# Patient Record
Sex: Female | Born: 1965 | Hispanic: Yes | Marital: Married | Smoking: Never smoker
Health system: Southern US, Community
[De-identification: ages and names within clinical notes are randomized; demographics above are authoritative.]

## PROBLEM LIST (undated history)

## (undated) DIAGNOSIS — E119 Type 2 diabetes mellitus without complications: Secondary | ICD-10-CM

## (undated) DIAGNOSIS — I10 Essential (primary) hypertension: Secondary | ICD-10-CM

## (undated) DIAGNOSIS — T7840XA Allergy, unspecified, initial encounter: Secondary | ICD-10-CM

## (undated) HISTORY — PX: BREAST SURGERY: SHX581

## (undated) HISTORY — PX: CHOLECYSTECTOMY: SHX55

## (undated) HISTORY — PX: TUBAL LIGATION: SHX77

## (undated) HISTORY — PX: ABDOMINAL HYSTERECTOMY: SHX81

## (undated) HISTORY — DX: Allergy, unspecified, initial encounter: T78.40XA

---

## 1998-01-07 ENCOUNTER — Emergency Department (HOSPITAL_COMMUNITY): Admission: EM | Admit: 1998-01-07 | Discharge: 1998-01-07 | Payer: Self-pay | Admitting: Emergency Medicine

## 1998-02-03 ENCOUNTER — Ambulatory Visit (HOSPITAL_COMMUNITY): Admission: RE | Admit: 1998-02-03 | Discharge: 1998-02-03 | Payer: Self-pay | Admitting: Nephrology

## 1998-09-22 ENCOUNTER — Other Ambulatory Visit: Admission: RE | Admit: 1998-09-22 | Discharge: 1998-09-22 | Payer: Self-pay | Admitting: Obstetrics and Gynecology

## 1999-10-05 ENCOUNTER — Encounter: Payer: Self-pay | Admitting: Nephrology

## 1999-10-05 ENCOUNTER — Encounter: Admission: RE | Admit: 1999-10-05 | Discharge: 1999-10-05 | Payer: Self-pay | Admitting: Nephrology

## 1999-11-03 ENCOUNTER — Ambulatory Visit (HOSPITAL_COMMUNITY): Admission: RE | Admit: 1999-11-03 | Discharge: 1999-11-03 | Payer: Self-pay | Admitting: Nephrology

## 1999-11-03 ENCOUNTER — Encounter: Payer: Self-pay | Admitting: Nephrology

## 1999-11-06 ENCOUNTER — Encounter: Admission: RE | Admit: 1999-11-06 | Discharge: 1999-11-06 | Payer: Self-pay | Admitting: Nephrology

## 1999-11-06 ENCOUNTER — Encounter: Payer: Self-pay | Admitting: Nephrology

## 2000-06-18 ENCOUNTER — Encounter: Payer: Self-pay | Admitting: Nephrology

## 2000-06-18 ENCOUNTER — Encounter: Admission: RE | Admit: 2000-06-18 | Discharge: 2000-06-18 | Payer: Self-pay | Admitting: Nephrology

## 2000-07-05 ENCOUNTER — Emergency Department (HOSPITAL_COMMUNITY): Admission: EM | Admit: 2000-07-05 | Discharge: 2000-07-05 | Payer: Self-pay | Admitting: Emergency Medicine

## 2000-10-11 ENCOUNTER — Encounter: Payer: Self-pay | Admitting: Nephrology

## 2000-10-11 ENCOUNTER — Encounter: Admission: RE | Admit: 2000-10-11 | Discharge: 2000-10-11 | Payer: Self-pay | Admitting: Nephrology

## 2001-01-07 ENCOUNTER — Other Ambulatory Visit: Admission: RE | Admit: 2001-01-07 | Discharge: 2001-01-07 | Payer: Self-pay | Admitting: *Deleted

## 2001-03-07 ENCOUNTER — Encounter: Payer: Self-pay | Admitting: *Deleted

## 2001-03-07 ENCOUNTER — Ambulatory Visit (HOSPITAL_COMMUNITY): Admission: RE | Admit: 2001-03-07 | Discharge: 2001-03-07 | Payer: Self-pay | Admitting: *Deleted

## 2001-06-23 ENCOUNTER — Encounter (INDEPENDENT_AMBULATORY_CARE_PROVIDER_SITE_OTHER): Payer: Self-pay | Admitting: Specialist

## 2001-06-23 ENCOUNTER — Observation Stay (HOSPITAL_COMMUNITY): Admission: RE | Admit: 2001-06-23 | Discharge: 2001-06-24 | Payer: Self-pay | Admitting: *Deleted

## 2001-10-02 ENCOUNTER — Encounter: Payer: Self-pay | Admitting: Nephrology

## 2001-10-02 ENCOUNTER — Encounter: Admission: RE | Admit: 2001-10-02 | Discharge: 2001-10-02 | Payer: Self-pay | Admitting: Nephrology

## 2002-01-28 ENCOUNTER — Encounter: Payer: Self-pay | Admitting: Nephrology

## 2002-01-28 ENCOUNTER — Encounter: Admission: RE | Admit: 2002-01-28 | Discharge: 2002-01-28 | Payer: Self-pay | Admitting: Nephrology

## 2007-06-05 ENCOUNTER — Emergency Department (HOSPITAL_COMMUNITY): Admission: EM | Admit: 2007-06-05 | Discharge: 2007-06-05 | Payer: Self-pay | Admitting: Emergency Medicine

## 2007-06-19 ENCOUNTER — Emergency Department (HOSPITAL_COMMUNITY): Admission: EM | Admit: 2007-06-19 | Discharge: 2007-06-20 | Payer: Self-pay | Admitting: Emergency Medicine

## 2007-09-25 ENCOUNTER — Encounter: Admission: RE | Admit: 2007-09-25 | Discharge: 2007-09-25 | Payer: Self-pay | Admitting: Nephrology

## 2007-10-04 ENCOUNTER — Emergency Department (HOSPITAL_COMMUNITY): Admission: EM | Admit: 2007-10-04 | Discharge: 2007-10-04 | Payer: Self-pay | Admitting: Emergency Medicine

## 2008-01-08 ENCOUNTER — Ambulatory Visit: Payer: Self-pay | Admitting: Family Medicine

## 2008-02-18 ENCOUNTER — Ambulatory Visit (HOSPITAL_COMMUNITY): Admission: RE | Admit: 2008-02-18 | Discharge: 2008-02-18 | Payer: Self-pay | Admitting: General Surgery

## 2008-02-24 ENCOUNTER — Encounter: Admission: RE | Admit: 2008-02-24 | Discharge: 2008-02-24 | Payer: Self-pay | Admitting: General Surgery

## 2008-03-09 ENCOUNTER — Encounter (INDEPENDENT_AMBULATORY_CARE_PROVIDER_SITE_OTHER): Payer: Self-pay | Admitting: General Surgery

## 2008-03-09 ENCOUNTER — Ambulatory Visit (HOSPITAL_COMMUNITY): Admission: RE | Admit: 2008-03-09 | Discharge: 2008-03-09 | Payer: Self-pay | Admitting: General Surgery

## 2009-02-10 ENCOUNTER — Encounter: Admission: RE | Admit: 2009-02-10 | Discharge: 2009-02-10 | Payer: Self-pay | Admitting: Nephrology

## 2010-02-14 ENCOUNTER — Encounter: Admission: RE | Admit: 2010-02-14 | Discharge: 2010-02-14 | Payer: Self-pay | Admitting: Nephrology

## 2010-10-10 NOTE — Op Note (Signed)
[name], Madison Huynh              ACCOUNT NO.:  [id number]   MEDICAL RECORD NO.:  [id number]          PATIENT TYPE:  AMB   LOCATION:  DAY                          FACILITY:  [facility]   PHYSICIAN:  Almond Lint, MD       DATE OF BIRTH:  27-Oct-1965   DATE OF PROCEDURE:  03/09/2008  DATE OF DISCHARGE:                               OPERATIVE REPORT   PREOPERATIVE DIAGNOSIS:  Symptomatic cholelithiasis.   POSTOPERATIVE DIAGNOSIS:  Symptomatic cholelithiasis.   PROCEDURE:  Laparoscopic cholecystectomy with intraoperative  cholangiogram.   SURGEON:  Almond Lint, M.D.   ASSISTANT:  Anselm Pancoast. Zachery Dakins, M.D.   ANESTHESIA:  General and local.   FINDINGS:  Gallbladder with several large stones.  No filling defects on  the cholangiogram.   SPECIMENS:  Gallbladder to pathology.   ESTIMATED BLOOD LOSS:  Minimal.   COMPLICATIONS:  None known.   PROCEDURE:  [name] was identified in the holding area and taken to  the operating room, where she was placed supine on the operating room  table.  General anesthesia was induced.  Because of her prior  infraumbilical incision, we infiltrated the area above the umbilicus.  The 11 blade was used to make a vertical incision into the belly button.  The Kelly clamp was used to spread the subcutaneous tissues, and the  umbilical stalk was elevated using a Market researcher.  An additional Kocher was  placed on one side of the midline and the other Kocher placed on the  opposite side of the midline.  The 11 blade was used to incise the  fascia.  A Kelly clamp was used to confirm that we were into the  peritoneal cavity.  A 0 Vicryl on a UR6 needle was used to place a purse-  string suture around the fascial incision.  The Hasson trocar was then  advanced into the abdomen and secured with the sutures.  The  pneumoperitoneum was achieved to a pressure of 15 mmHg.  The epigastric  region was inspected, and the peritoneum and the skin both infiltrated  with local  anesthesia.  The 11 blade was used to make a transverse  incision.  The 10-mm trocar placed under direct visualization.  The  right upper quadrant was then inspected, and the patient was placed in  reverse Trendelenburg and rotated to the left.  Two 5-mm ports were  placed under direct visualization laterally.  Locking graspers were used  to retract the gallbladder cranially, and the infundibulum laterally.  The [place] dissector was placed at the epigastric port and used to  strip the peritoneum from the infundibulum of the gallbladder.  A small  artery was seen coursing over what appeared to be the duct, and this was  clipped.  The duct was then identified and dissected with the [place].  This was seen to be entering the gallbladder directly.  A  critical view was obtained.  One clip was placed on the duct on the  gallbladder side, and a ductotomy made with the Endoshears.  A Cook  catheter was advanced through the abdomen and placed  into the ductotomy  and clipped in place.  Patient was returned to the supine position, and  the gas was allowed to evacuate from the abdomen.  The cholangiogram was  shot with a cholangiocatheter and demonstrated no filling defects with  everything down to the duodenum.  The right and left ducts filled  slightly.  The common duct had no filling defects.  The  cholangiocatheter was then removed the rest of the way from the cystic  duct and the cystic duct clipped proximally three times.  Endoshears  were used to transect the duct the rest of the way.  The [place] was  then used to dissect free an additional vessel.  This additional small  vessel was clipped twice on the specimen side.  It was then cut.  Bovie  electrocautery was then used to remove the gallbladder from the  gallbladder fossa.  Once this was done, the gallbladder fossa was  inspected for bleeding and several small sites were oozing.  This was  irrigated again and reinspected for  bleeding, and there was none.  The  camera was m moved to the epigastric port, and the EndoCatch bag placed  at the umbilicus.  The gallbladder was placed in the EndoCatch bag and  then removed along with the trocar from the umbilical site.  A Kelly  clamp was used to stretch the fascia in order to pull out the  gallbladder from the incision.  The trocar was then reintroduced into  the abdomen.  The gallbladder fossa was reinspected.  There was one tiny  side oozing that was coagulated and irrigant returned clean.  The two  right upper quadrant ports and the epigastric port were removed under  direct visualization.  No bleeding was seen.  The pneumoperitoneum was  allowed to evacuate, and the Hasson trocar removed from the abdomen.  The purse-string suture was used to secure the fascia, and there was no  defect palpable at the umbilical fascial incision.  The skin over all of  the incisions was closed using a 4-0 Monocryl in a running subcuticular  fashion.  The incisions were then cleaned, dried, and dressed with  Dermabond.  The patient's was awakened from anesthesia and brought to  the PACU in stable condition.  Needle and sponge counts were correct x2.      Almond Lint, MD  Electronically Signed     FB/MEDQ  D:  03/09/2008  T:  03/09/2008  Job:  [id number]

## 2010-10-13 NOTE — Discharge Summary (Signed)
[facility]  Patient:    Madison Huynh, Madison Huynh Visit Number: [id number] MRN: [id number]          Service Type: GYN Location: 910A 9130 01 Attending Physician:  Wetzel Bjornstad Dictated by:   Antony Contras, [facility] Admit Date:  06/23/2001 Discharge Date: 06/24/2001                             Discharge Summary  DISCHARGE DIAGNOSES:          1. Menorrhagia.                               2. Endometrial polyp.  PROCEDURES:                   Total vaginal hysterectomy.  HISTORY OF PRESENT ILLNESS:   The patient is a 45 year old gravida 3, para 3 who presented to the office complaining of heavy vaginal bleeding. Saline infusion sonography showed a posterior wall defect with an endometrial polyp. 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 The patient was initially scheduled for hysteroscopic polypectomy but wished to have definitive surgery since she has does not plan to have more children.  PAST MEDICAL HISTORY:         History of previous bilateral tubal sterilization; otherwise negative.  HOSPITAL COURSE/TREATMENT:    The patient was admitted on June 23, 2001. Total vaginal hysterectomy was performed by Dr. Penni Homans assisted by Dr. Lily Peer. Findings included a normal size uterus with endometrial polyp, normal appearing ovaries and tubes.  POSTOPERATIVE COURSE:         The patient remained afebrile and was able to be discharged in satisfactory condition on her first postoperative day.  CBC:  Hematocrit 29.5, hemoglobin 9.9, WBCs 4.7, platelets 243,000.  DISPOSITION:                  The patient is to follow up in the office in three weeks. Dictated by:   Antony Contras, [facility] Attending Physician:  Wetzel Bjornstad DD:  07/04/01 TD:  07/06/01 Job: [id number]

## 2010-10-13 NOTE — Op Note (Signed)
[facility]  Patient:    Madison Huynh, Madison Huynh Visit Number: [id number] MRN: [id number]          Service Type: DSU Location: 910A 9130 01 Attending Physician:  Wetzel Bjornstad Dictated by:   Katy Fitch, M.D. Proc. Date: 06/23/01 Admit Date:  06/23/2001                             Operative Report  PREOPERATIVE DIAGNOSES:       1. Menorrhagia.                               2. Endometrial polyp.  POSTOPERATIVE DIAGNOSES:      1. Menorrhagia.                               2. Endometrial polyp.  PROCEDURE:                    Total vaginal hysterectomy.  SURGEON:                      Katy Fitch, M.D.  ASSISTANTGaetano Hawthorne. Lily Peer, M.D.  ANESTHESIA:                   General.  ESTIMATED BLOOD LOSS:         Less than 50 cc.  URINE OUTPUT:                 50 cc clear.  FLUIDS:                       1650 crystalloid.  COMPLICATIONS:                None.  SPECIMENS:                    Uterus.  FINDINGS:                     Normal-sized uterus with endometrial polyp. Normal-appearing tubes and ovaries.  DESCRIPTION OF PROCEDURE:     The patient was taken to the operating room, where general anesthesia was administered.  The patient was prepped and draped in the normal sterile fashion.  A Foley catheter was placed in the bladder and clear urine return was noted.  A weighted speculum was then placed in the vagina and the anterior lip of the cervix was grasped with a Christella Hartigan tenaculum. Pitressin solution was then injected circumferentially in the cervicovaginal mucosa.  A scalpel was then used to incise the mucosa down to the pubovesical cervical fascia.  The cervicovaginal mucosa was then dissected off sharply with Mayo scissors and bluntly using Ray-Tec on a forefinger.  The posterior peritoneum was identified, grasped with a pickup and entered sharply with Metzenbaum scissors.  A long weighted speculum was then  placed into the posterior cul-de-sac.  Entrance into the anterior cul-de-sac was then performed by grasping the peritoneum and incising sharply and placing a Deaver retractor into the anterior cul-de-sac.  A Heaney clamp was then placed on the left uterosacral and this pedicle clamped, cut and suture ligated with 0 Vicryl and held with a hemostat.  This  was then done similarly on the right. The uterine vessels were then clamped on the left and suture ligated with 0 Vicryl and cut.  This was done similarly on the right.  The broad ligament was then clamped and cut with 0 Vicryl suture for ligation.  This was done on both sides.  Then, the utero-ovarian ligaments were then cross clamped and the specimen was surgically removed.  The utero-ovarian pedicles were then doubly ligated, first with a suture and then with a free tie on both sides. Hemostasis was noted.  A ______ sponge was placed into the posterior cul-de-sac and inspection of the ovaries was noted to be normal.  There was no bleeding noted on the cuff or at each pedicle.  At this point, a running stitch starting at the left uterosacral was whip stitched to the right uterosacral to close the posterior part of the cuff.  The left angle was then sutured using a vertical mattress suture of 0 Vicryl.  This was done similarly on the right.  Three interrupted vertical mattresses of the cuff were used to close the cuff in an interrupted fashion.  The uterosacral ligaments were then injected with 0.25% Marcaine along with the vaginal cuff.  The pedicle sutures were then cut and the patient was taken to the recovery room in stable condition. Dictated by:   Katy Fitch, M.D. Attending Physician:  Wetzel Bjornstad DD:  06/23/01 TD:  06/23/01 Job: [id number]

## 2010-10-13 NOTE — H&P (Signed)
[facility]  Patient:    Madison Huynh, Madison Huynh Visit Number: [id number] MRN: [id number]          Service Type: Attending:  Katy Fitch, M.D. Dictated by:   Katy Fitch, M.D. Adm. Date:  06/20/01                           History and Physical  CHIEF COMPLAINT:              Uterine polyp with menorrhagia.  HISTORY OF PRESENT ILLNESS:   The patient is a 45 year old, G3, P3, who was seen in the office complaining of heavy menstrual bleeding.  The patient underwent saline infusion sonogram which showed a posterior wall defect consistent with an endometrial polyp.  Endometrial biopsy was performed showing secretory endometrium with no other abnormalities, hyperplasia, or malignancy.  The patient was initially scheduled for a hysteroscopic polypectomy; however, she has reconsidered and wants to have definitive surgical therapy, for she has had a previous tubal ligation and does not desire to have any more children.  The patient is scheduled for a total vaginal hysterectomy.  PAST MEDICAL HISTORY:         None.  PAST SURGICAL HISTORY:        Bilateral tubal ligation.  MEDICATIONS:                  None.  ALLERGIES:                    None.  SOCIAL HISTORY:               The patient is married, denies any tobacco, alcohol, or drugs.  FAMILY HISTORY:               Without any mental retardation or epithelial cancers.  PHYSICAL EXAMINATION:  VITAL SIGNS:                  Blood pressure 112/70.  HEENT:                        Clear.  LUNGS:                        Clear to auscultation bilaterally.  HEART:                        Regular rate and rhythm.  ABDOMEN:                      Soft, nontender, no palpable masses.  Normal abdominal bowel sounds.  EXTREMITIES:                  Without any edema, clubbing, cyanosis, or clonus.  PELVIC:                       Normal external genitalia.  CERVIX:                       Without any gross  lesions.  Uterus firm, mobile and intact.  Adnexa without any masses, nontender.  ASSESSMENT AND PLAN:          A 45 year old G3, P3, with uterine polyp and menorrhagia.  Will perform total vaginal hysterectomy.  The patient was discussed due to her age, we will leave her ovaries in unless there  are abnormalities.  The patient was explained the risks of the procedure including bleeding; transfusion; hepatitis; or human immunodeficiency virus with transfusion; infection; scar tissue; wound breakdown; damage to internal organs including the bowel, bladder, or ureters; fistula formation; need for laparotomy; anesthetic complications; blood clots; stroke; pulmonary embolism; death; and also inability to conceive in the future due to removal of the uterus.  The patient states she understands these risks and desires to proceed.  She will have her surgery on the morning of the 27th. Dictated by:   Katy Fitch, M.D. Attending:  Katy Fitch, M.D. DD:  06/20/01 TD:  06/20/01 Job: [id number]

## 2011-02-27 LAB — HEMOGLOBIN AND HEMATOCRIT, BLOOD: HCT: 38.6

## 2011-03-02 ENCOUNTER — Other Ambulatory Visit: Payer: Self-pay | Admitting: Obstetrics and Gynecology

## 2011-03-06 ENCOUNTER — Other Ambulatory Visit: Payer: Self-pay

## 2011-04-09 ENCOUNTER — Ambulatory Visit
Admission: RE | Admit: 2011-04-09 | Discharge: 2011-04-09 | Disposition: A | Discharge status: Home / Self Care | Payer: No Typology Code available for payment source | Source: Ambulatory Visit | Attending: Obstetrics and Gynecology | Admitting: Obstetrics and Gynecology

## 2011-04-09 MED ORDER — IOHEXOL 300 MG/ML  SOLN
100.0000 mL | Freq: Once | INTRAMUSCULAR | Status: AC | PRN
  Administered 2011-04-09: 100 mL via INTRAVENOUS

## 2013-04-07 ENCOUNTER — Ambulatory Visit: Payer: BC Managed Care – PPO | Admitting: Family Medicine

## 2013-04-07 VITALS — BP 128/76 | HR 106 | Temp 99.8°F | Resp 16 | Ht 62.0 in | Wt 199.4 lb

## 2013-04-07 DIAGNOSIS — R319 Hematuria, unspecified: Secondary | ICD-10-CM

## 2013-04-07 DIAGNOSIS — R42 Dizziness and giddiness: Secondary | ICD-10-CM

## 2013-04-07 DIAGNOSIS — R509 Fever, unspecified: Secondary | ICD-10-CM

## 2013-04-07 DIAGNOSIS — N390 Urinary tract infection, site not specified: Secondary | ICD-10-CM

## 2013-04-07 LAB — POCT CBC
Granulocyte percent: 82.6 %G — AB (ref 37–80)
HCT, POC: 42.3 % (ref 37.7–47.9)
Hemoglobin: 13 g/dL (ref 12.2–16.2)
Lymph, poc: 1.3 (ref 0.6–3.4)
MCH, POC: 27.7 pg (ref 27–31.2)
MCHC: 30.7 g/dL — AB (ref 31.8–35.4)
MCV: 90.2 fL (ref 80–97)
MID (cbc): 0.6 (ref 0–0.9)
MPV: 7.8 fL (ref 0–99.8)
POC Granulocyte: 8.8 — AB (ref 2–6.9)
POC LYMPH PERCENT: 11.9 %L (ref 10–50)
POC MID %: 5.5 %M (ref 0–12)
Platelet Count, POC: 293 10*3/uL (ref 142–424)
RBC: 4.69 M/uL (ref 4.04–5.48)
RDW, POC: 13.9 %
WBC: 10.6 10*3/uL — AB (ref 4.6–10.2)

## 2013-04-07 LAB — POCT UA - MICROSCOPIC ONLY
Casts, Ur, LPF, POC: NEGATIVE
Crystals, Ur, HPF, POC: NEGATIVE
Mucus, UA: POSITIVE
Yeast, UA: NEGATIVE

## 2013-04-07 LAB — POCT URINALYSIS DIPSTICK
Glucose, UA: NEGATIVE
Ketones, UA: 40
Leukocytes, UA: NEGATIVE
Nitrite, UA: NEGATIVE
Protein, UA: 30
Spec Grav, UA: 1.025
Urobilinogen, UA: 1
pH, UA: 6.5

## 2013-04-07 LAB — GLUCOSE, POCT (MANUAL RESULT ENTRY): POC Glucose: 129 mg/dl — AB (ref 70–99)

## 2013-04-07 MED ORDER — CIPROFLOXACIN HCL 500 MG PO TABS: 500.0000 mg | ORAL_TABLET | Freq: Two times a day (BID) | ORAL | Status: DC

## 2013-04-07 NOTE — Patient Instructions (Signed)
I am concerned with the blood in the urine.  You appear to have a urinary tract infection, but the blood is atypical for a simple urinary infection   Hematuria, Adult Hematuria (blood in your urine) can be caused by a bladder infection (cystitis), kidney infection (pyelonephritis), prostate infection (prostatitis), or kidney stone. Infections will usually respond to antibiotics (medications which kill germs), and a kidney stone will usually pass through your urine without further treatment. If you were put on antibiotics, take all the medicine until gone. You may feel better in a few days, but take all of your medicine or the infection may not respond and become more difficult to treat. If antibiotics were not given, an infection did not cause the blood in the urine. A further work up to find out the reason may be needed. HOME CARE INSTRUCTIONS   Drink lots of fluid, 3 to 4 quarts a day. If you have been diagnosed with an infection, cranberry juice is especially recommended, in addition to large amounts of water.  Avoid caffeine, tea, and carbonated beverages, because they tend to irritate the bladder.  Avoid alcohol as it may irritate the prostate.  Only take over-the-counter or prescription medicines for pain, discomfort, or fever as directed by your caregiver.  If you have been diagnosed with a kidney stone follow your caregivers instructions regarding straining your urine to catch the stone. TO PREVENT FURTHER INFECTIONS:  Empty the bladder often. Avoid holding urine for long periods of time.  After a bowel movement, women should cleanse front to back. Use each tissue only once.  Empty the bladder before and after sexual intercourse if you are a female.  Return to your caregiver if you develop back pain, fever, nausea (feeling sick to your stomach), vomiting, or your symptoms (problems) are not better in 3 days. Return sooner if you are getting worse. If you have been requested to return  for further testing make sure to keep your appointments. If an infection is not the cause of blood in your urine, X-rays may be required. Your caregiver will discuss this with you. SEEK IMMEDIATE MEDICAL CARE IF:   You have a persistent fever over 102 F (38.9 C).  You develop severe vomiting and are unable to keep the medication down.  You develop severe back or abdominal pain despite taking your medications.  You begin passing a large amount of blood or clots in your urine.  You feel extremely weak or faint, or pass out. MAKE SURE YOU:   Understand these instructions.  Will watch your condition.  Will get help right away if you are not doing well or get worse. Document Released: 05/14/2005 Document Revised: 08/06/2011 Document Reviewed: 01/01/2008 [facility] Patient Information 2014 [place], [place].

## 2013-04-07 NOTE — Progress Notes (Signed)
Subjective:    Patient ID: Madison Huynh, female    DOB: Aug 21, 1965, 47 y.o.   MRN: [id number]  This chart was scribed for Elvina Sidle, MD by Blanchard Kelch, ED Scribe. The patient was seen in room 11. Patient's care was started at 9:07 PM.   HPI  Madison Huynh is a 47 y.o. female who presents to office complaining of central and lower abdominal pain that began yesterday after getting back from [place]. The pain radiates to her back. She has associated fever, chills, headache and diarrhea that has been worsening since being picked up from the airport. She ate something at the airport in [place] that may have been suspicious. Some family members who ate the same thing were complaining of similar symptoms. Her family states that every time she takes a trip to [place] her feet and legs swell. She denies a medical history of diabetes.  She and her family are from [place].   Review of Systems  Constitutional: Positive for fever and chills.  HENT: Negative for drooling.   Eyes: Negative for discharge.  Respiratory: Negative for cough.   Cardiovascular: Positive for leg swelling.  Gastrointestinal: Positive for abdominal pain and diarrhea. Negative for vomiting.  Endocrine: Negative for polyuria.  Musculoskeletal: Negative for gait problem.  Skin: Negative for rash.  Allergic/Immunologic: Negative for immunocompromised state.  Neurological: Positive for headaches. Negative for speech difficulty.  Hematological: Negative for adenopathy.  Psychiatric/Behavioral: Negative for confusion.       Objective:   Physical Exam  Nursing note and vitals reviewed. Constitutional: She is oriented to person, place, and time. She appears well-developed and well-nourished. No distress.  HENT:  Head: Normocephalic and atraumatic.  Eyes: EOM are normal.  Neck: Neck supple. No tracheal deviation present.  Cardiovascular: Normal rate.   Pulmonary/Chest: Effort normal. No respiratory  distress.  Abdominal: There is tenderness.  Musculoskeletal: Normal range of motion.  Neurological: She is alert and oriented to person, place, and time.  Skin: Skin is warm and dry.  Psychiatric: She has a normal mood and affect. Her behavior is normal.   Patient is tender in epigastrium  Results for orders placed in visit on 04/07/13  POCT URINALYSIS DIPSTICK      Result Value Range   Color, UA yellow     Clarity, UA clear     Glucose, UA neg     Bilirubin, UA small     Ketones, UA 40     Spec Grav, UA 1.025     Blood, UA tr-lysed     pH, UA 6.5     Protein, UA 30     Urobilinogen, UA 1.0     Nitrite, UA neg     Leukocytes, UA Negative    POCT UA - MICROSCOPIC ONLY      Result Value Range   WBC, Ur, HPF, POC 1-3     RBC, urine, microscopic 14-17     Bacteria, U Microscopic 2+     Mucus, UA pos     Epithelial cells, urine per micros 2-3     Crystals, Ur, HPF, POC neg     Casts, Ur, LPF, POC neg     Yeast, UA neg    POCT CBC      Result Value Range   WBC 10.6 (*) 4.6 - 10.2 K/uL   Lymph, poc 1.3  0.6 - 3.4   POC LYMPH PERCENT 11.9  10 - 50 %L   MID (cbc) 0.6  0 -  0.9   POC MID % 5.5  0 - 12 %M   POC Granulocyte 8.8 (*) 2 - 6.9   Granulocyte percent 82.6 (*) 37 - 80 %G   RBC 4.69  4.04 - 5.48 M/uL   Hemoglobin 13.0  12.2 - 16.2 g/dL   HCT, POC 16.1  09.6 - 47.9 %   MCV 90.2  80 - 97 fL   MCH, POC 27.7  27 - 31.2 pg   MCHC 30.7 (*) 31.8 - 35.4 g/dL   RDW, POC 04.5     Platelet Count, POC 293  142 - 424 K/uL   MPV 7.8  0 - 99.8 fL  GLUCOSE, POCT (MANUAL RESULT ENTRY)      Result Value Range   POC Glucose 129 (*) 70 - 99 mg/dl       Assessment & Plan:  Fever, unspecified - Plan: POCT urinalysis dipstick, POCT UA - Microscopic Only, POCT CBC, POCT glucose (manual entry), Urine culture, ciprofloxacin (CIPRO) 500 MG tablet  Dizziness and giddiness - Plan: POCT urinalysis dipstick, POCT UA - Microscopic Only, POCT CBC, POCT glucose (manual entry), Urine  culture  Hematuria - Plan: ciprofloxacin (CIPRO) 500 MG tablet, Ambulatory referral to Urology  Signed, Elvina Sidle, MD    I personally performed the services described in this documentation, which was scribed in my presence. The recorded information has been reviewed and is accurate.

## 2013-04-09 LAB — URINE CULTURE: Organism ID, Bacteria: 50000

## 2013-10-14 ENCOUNTER — Ambulatory Visit: Payer: BC Managed Care – PPO | Admitting: Family Medicine

## 2013-10-14 VITALS — BP 122/74 | HR 76 | Temp 98.0°F | Resp 17 | Ht 61.0 in | Wt 193.0 lb

## 2013-10-14 DIAGNOSIS — R059 Cough, unspecified: Secondary | ICD-10-CM

## 2013-10-14 DIAGNOSIS — J329 Chronic sinusitis, unspecified: Secondary | ICD-10-CM

## 2013-10-14 DIAGNOSIS — H109 Unspecified conjunctivitis: Secondary | ICD-10-CM

## 2013-10-14 DIAGNOSIS — R05 Cough: Secondary | ICD-10-CM

## 2013-10-14 MED ORDER — HYDROCODONE-HOMATROPINE 5-1.5 MG/5ML PO SYRP: 5.0000 mL | ORAL_SOLUTION | Freq: Three times a day (TID) | ORAL | Status: AC | PRN

## 2013-10-14 MED ORDER — TOBRAMYCIN 0.3 % OP SOLN: 1.0000 [drp] | Freq: Four times a day (QID) | OPHTHALMIC | Status: AC

## 2013-10-14 MED ORDER — PREDNISONE 20 MG PO TABS: ORAL_TABLET | ORAL | Status: AC

## 2013-10-14 MED ORDER — AMOXICILLIN 875 MG PO TABS: 875.0000 mg | ORAL_TABLET | Freq: Two times a day (BID) | ORAL | Status: DC

## 2013-10-14 NOTE — Patient Instructions (Signed)

## 2013-10-14 NOTE — Progress Notes (Addendum)
° °  Subjective:    Patient ID: Madison Huynh, female    DOB: August 01, 1965, 48 y.o.   MRN: [id number]  Sinusitis Associated symptoms include congestion, sinus pressure and a sore throat.   Chief Complaint  Patient presents with   Sinusitis   Allergies   Nasal Congestion   Insomnia   Fatigue   This chart was scribed for Elvina SidleKurt Lauenstein, MD by Andrew Auaven Small, ED Scribe. This patient was seen in room 9 and the patient's care was started at 10:36 AM.  HPI Comments: Madison Huynh is a 48 y.o. female who presents to the [facility] complaining of worsening sinusitis onset month. Pt reports chest congestion, nasal congestion, sore irritated throat, eye redness, body aches that worsened last night. Pt has been unable to sleep due to symptoms. She reports symptoms worsen at night. Pt has tried OTC allergy medication without relief to symptoms.Pt denies using nasal spray.   Past Medical History  Diagnosis Date   Allergy    No Known Allergies Prior to Admission medications   Medication Sig Start Date End Date Taking? Authorizing Provider  cetirizine (ZYRTEC) 10 MG tablet Take 10 mg by mouth daily.   Yes Historical Provider, MD   Review of Systems  Constitutional: Positive for fatigue.  HENT: Positive for congestion, sinus pressure and sore throat.   Eyes: Positive for redness.      Objective:   Physical Exam  Nursing note and vitals reviewed. Constitutional: She is oriented to person, place, and time. She appears well-developed and well-nourished. No distress.  HENT:  Head: Normocephalic and atraumatic.  Nasal passage is swollen Right shows retraction with amber bubble (glue ear)  Eyes: EOM are normal. Left conjunctiva is injected.  Neck: Normal range of motion. Neck supple.  Cardiovascular: Normal rate.   Pulmonary/Chest: Effort normal. She has wheezes (faint  bilaterally).  Musculoskeletal: Normal range of motion.  Lymphadenopathy:    She has no cervical  adenopathy.  Neurological: She is alert and oriented to person, place, and time.  Skin: Skin is warm and dry.  Psychiatric: She has a normal mood and affect. Her behavior is normal.      Assessment & Plan:   1. Sinusitis   2. Conjunctivitis   3. Cough     Meds ordered this encounter  Medications   cetirizine (ZYRTEC) 10 MG tablet    Sig: Take 10 mg by mouth daily.   Sinusitis - Plan: predniSONE (DELTASONE) 20 MG tablet, amoxicillin (AMOXIL) 875 MG tablet  Conjunctivitis - Plan: tobramycin (TOBREX) 0.3 % ophthalmic solution  Cough - Plan: predniSONE (DELTASONE) 20 MG tablet, HYDROcodone-homatropine (HYCODAN) 5-1.5 MG/5ML syrup  Signed, Elvina SidleKurt Lauenstein, MD

## 2013-11-04 ENCOUNTER — Ambulatory Visit (INDEPENDENT_AMBULATORY_CARE_PROVIDER_SITE_OTHER): Payer: BC Managed Care – PPO | Admitting: Family Medicine

## 2013-11-04 VITALS — BP 101/69 | HR 69 | Temp 98.7°F | Resp 18 | Ht 61.0 in | Wt 191.0 lb

## 2013-11-04 DIAGNOSIS — R197 Diarrhea, unspecified: Secondary | ICD-10-CM

## 2013-11-04 DIAGNOSIS — R110 Nausea: Secondary | ICD-10-CM

## 2013-11-04 DIAGNOSIS — K529 Noninfective gastroenteritis and colitis, unspecified: Secondary | ICD-10-CM

## 2013-11-04 DIAGNOSIS — H6980 Other specified disorders of Eustachian tube, unspecified ear: Secondary | ICD-10-CM

## 2013-11-04 DIAGNOSIS — K5289 Other specified noninfective gastroenteritis and colitis: Secondary | ICD-10-CM

## 2013-11-04 DIAGNOSIS — R1084 Generalized abdominal pain: Secondary | ICD-10-CM

## 2013-11-04 LAB — POCT CBC
GRANULOCYTE PERCENT: 81.1 % — AB (ref 37–80)
HCT, POC: 41.2 % (ref 37.7–47.9)
HEMOGLOBIN: 13 g/dL (ref 12.2–16.2)
Lymph, poc: 0.7 (ref 0.6–3.4)
MCH: 27.8 pg (ref 27–31.2)
MCHC: 31.6 g/dL — AB (ref 31.8–35.4)
MCV: 88 fL (ref 80–97)
MID (CBC): 0.3 (ref 0–0.9)
MPV: 8.8 fL (ref 0–99.8)
POC GRANULOCYTE: 4.6 (ref 2–6.9)
POC LYMPH %: 12.9 % (ref 10–50)
POC MID %: 6 % (ref 0–12)
Platelet Count, POC: 284 10*3/uL (ref 142–424)
RBC: 4.68 M/uL (ref 4.04–5.48)
RDW, POC: 14.3 %
WBC: 5.7 10*3/uL (ref 4.6–10.2)

## 2013-11-04 LAB — GLUCOSE, POCT (MANUAL RESULT ENTRY): POC Glucose: 86 mg/dl (ref 70–99)

## 2013-11-04 MED ORDER — ONDANSETRON 4 MG PO TBDP: 4.0000 mg | ORAL_TABLET | Freq: Three times a day (TID) | ORAL | Status: DC | PRN

## 2013-11-04 NOTE — Progress Notes (Signed)
Subjective:    Patient ID: Madison Huynh, female    DOB: 11-22-1965, 48 y.o.   MRN: [id number]  This chart was scribed for Shade Flood, MD by Blanchard Kelch, ED Scribe.  Chief Complaint  Patient presents with  . Abdominal Pain    X 1DAY  . Diarrhea    X 1 DAY  . Nausea    X 1 DAY  . Headache    X1 DAY    PCP: No PCP Per Patient   HPI  Madison Huynh is a 48 y.o. female who presents to office complaining of abdominal pain, nausea, headache and diarrhea. Last seen 05/20 for sinusitis and cough. Prescribed Prednisone and Amoxicillin as well as eye drops and cough syrup.  Abdominal pain/Diarrhea: She started feeling abdominal "gurgling" around 5 PM yesterday. She also reports chills and subjective fever at that time. She states that watery, non-bloody diarrhea began around 7PM with about four or five episodes last night and three episodes this morning. She states that she felt heartburn last night but denies any episodes of vomiting. She has had a decreased appetite since the pain began and has not been drinking fluids either. 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 She denies recent foreign travel. She states that yesterday she ate at [facility] and ate meat there but did not eat any undercooked foods that she noticed. However, after eating there she felt like a "bowl in her stomach" in the epigastric region and has been having abdominal pain since then. She reports a past surgical history of cholecystectomy and hysterectomy. She denies recent antibiotic use other than the amoxicillin prescribed 5/20. She reports seasonal allergies that she is taking OTC Zyrtec for.   There are no active problems to display for this patient.  Past Medical History  Diagnosis Date  . Allergy    Past Surgical History  Procedure Laterality Date  . Breast surgery    . Cholecystectomy    . Abdominal hysterectomy    . Tubal ligation     No Known Allergies Prior to Admission  medications   Medication Sig Start Date End Date Taking? Authorizing Provider  ibuprofen (ADVIL,MOTRIN) 200 MG tablet Take 200 mg by mouth every 6 (six) hours as needed.   Yes Historical Provider, MD  ibuprofen (ADVIL,MOTRIN) 800 MG tablet Take 800 mg by mouth every 8 (eight) hours as needed.   Yes Historical Provider, MD  orlistat (ALLI) 60 MG capsule Take 60 mg by mouth 3 (three) times daily with meals.   Yes Historical Provider, MD  cetirizine (ZYRTEC) 10 MG tablet Take 10 mg by mouth daily.    Historical Provider, MD  HYDROcodone-homatropine (HYCODAN) 5-1.5 MG/5ML syrup Take 5 mLs by mouth every 8 (eight) hours as needed for cough. 10/14/13   Elvina Sidle, MD  predniSONE (DELTASONE) 20 MG tablet 2 daily with food 10/14/13   Elvina Sidle, MD  tobramycin (TOBREX) 0.3 % ophthalmic solution Place 1 drop into the left eye every 6 (six) hours. 10/14/13   Elvina Sidle, MD   History   Social History  . Marital Status: Married    Spouse Name: N/A    Number of Children: N/A  . Years of Education: N/A   Occupational History  . Not on file.   Social History Main Topics  . Smoking status: Never Smoker   . Smokeless tobacco: Not on file  . Alcohol Use: No  . Drug Use: No  . Sexual Activity: Not  on file   Other Topics Concern  . Not on file   Social History Narrative  . No narrative on file     Review of Systems  Constitutional: Positive for fever (subjective), chills and appetite change.  HENT: Negative for rhinorrhea.   Eyes: Negative for visual disturbance.  Respiratory: Negative for cough and shortness of breath.   Cardiovascular: Negative for chest pain and leg swelling.  Gastrointestinal: Positive for nausea, abdominal pain and diarrhea. Negative for vomiting.  Genitourinary: Negative for dysuria, frequency, hematuria and decreased urine volume.  Musculoskeletal: Negative for back pain.  Skin: Negative for rash.  Neurological: Positive for headaches.  Hematological:  Does not bruise/bleed easily.  Psychiatric/Behavioral: Negative for confusion.       Objective:   Physical Exam  Nursing note and vitals reviewed. Constitutional: She is oriented to person, place, and time. She appears well-developed and well-nourished. No distress.  HENT:  Head: Normocephalic and atraumatic.  Right Ear: Ear canal normal.  Left Ear: Ear canal normal.  Nose: Mucosal edema present.  Mouth/Throat: Oropharynx is clear and moist.  Moist oral mucosa in the mouth. Clear fluid behind right and left TM without erythema. Canals are clear bilaterally.  Edema in nasal turbinates bilaterally.   Eyes: Conjunctivae and EOM are normal.  Neck: Normal range of motion. No tracheal deviation present.  Cardiovascular: Normal rate and regular rhythm.  Exam reveals no gallop and no friction rub.   No murmur heard. Pulmonary/Chest: Effort normal. No respiratory distress.  Abdominal: Soft. She exhibits no distension. Bowel sounds are increased. There is tenderness. There is no rebound and no guarding.  Minimal RLQ tenderness. No CVA tenderness. Negative Murphy's sign. Negative heel jar.  Musculoskeletal: Normal range of motion.  Neurological: She is alert and oriented to person, place, and time.  Skin: Skin is warm and dry.  Psychiatric: She has a normal mood and affect. Her behavior is normal.    Filed Vitals:   11/04/13 0942  BP: 101/69  Pulse: 69  Temp: 98.7 F (37.1 C)  TempSrc: Oral  Resp: 18  Height: 5\' 1"  (1.549 m)  Weight: 191 lb (86.637 kg)  SpO2: 97%   Results for orders placed in visit on 11/04/13  POCT CBC      Result Value Ref Range   WBC 5.7  4.6 - 10.2 K/uL   Lymph, poc 0.7  0.6 - 3.4   POC LYMPH PERCENT 12.9  10 - 50 %L   MID (cbc) 0.3  0 - 0.9   POC MID % 6.0  0 - 12 %M   POC Granulocyte 4.6  2 - 6.9   Granulocyte percent 81.1 (*) 37 - 80 %G   RBC 4.68  4.04 - 5.48 M/uL   Hemoglobin 13.0  12.2 - 16.2 g/dL   HCT, POC 16.1  09.6 - 47.9 %   MCV 88.0  80 -  97 fL   MCH, POC 27.8  27 - 31.2 pg   MCHC 31.6 (*) 31.8 - 35.4 g/dL   RDW, POC 04.5     Platelet Count, POC 284  142 - 424 K/uL   MPV 8.8  0 - 99.8 fL  GLUCOSE, POCT (MANUAL RESULT ENTRY)      Result Value Ref Range   POC Glucose 86  70 - 99 mg/dl       Assessment & Plan:   Madison Huynh is a 48 y.o. female Nausea, Abdominal pain, generalized - Plan: POCT CBC, POCT glucose (manual entry),  Diarrhea  - reassuring exam and CBC, afebrile.  Suspect viral GE, and secondary myalgias. Sx care discussed, ORT with frequent small sips of fluids, Zofran if needed. rtc precautions - if any fever returns, incr abd pain or not improving in next few days - rtc for recheck.  ETD (eustachian tube dysfunction) - suspect small degree of serous otitis or ETD, no acute infection seen at this point. May be underlying AR.  Can continue zyrtec, add otc flonase NS, rtc if not improving or worsens.    Meds ordered this encounter  Medications  . ibuprofen (ADVIL,MOTRIN) 200 MG tablet    Sig: Take 200 mg by mouth every 6 (six) hours as needed.  Marland Kitchen. ibuprofen (ADVIL,MOTRIN) 800 MG tablet    Sig: Take 800 mg by mouth every 8 (eight) hours as needed.  Marland Kitchen. orlistat (ALLI) 60 MG capsule    Sig: Take 60 mg by mouth 3 (three) times daily with meals.   There are no Patient Instructions on file for this visit.   I personally performed the services described in this documentation, which was scribed in my presence. The recorded information has been reviewed and considered, and addended by me as needed.

## 2013-11-04 NOTE — Patient Instructions (Signed)
Flonase nasal spray and continue zyrtec for allergies as this may help with congestion in ears. If your ear symptoms worsen - return for recheck.   Drink small sips of fluids frequently as we discussed. Zofran if needed for nausea and recheck in the next 2-3 days if not improving, sooner if worse. See other instructions below. Return to the clinic or go to the nearest emergency room if any of your symptoms worsen or new symptoms occur.   Gastroenteritis:  Diarrhea Infections caused by germs (bacterial) or a virus commonly cause diarrhea. Your caregiver has determined that with time, rest and fluids, the diarrhea should improve. In general, eat normally while drinking more water than usual. Although water may prevent dehydration, it does not contain salt and minerals (electrolytes). Broths, weak tea without caffeine and oral rehydration solutions (ORS) replace fluids and electrolytes. Small amounts of fluids should be taken frequently. Large amounts at one time may not be tolerated. Plain water may be harmful in infants and the elderly. Oral rehydrating solutions (ORS) are available at pharmacies and grocery stores. ORS replace water and important electrolytes in proper proportions. Sports drinks are not as effective as ORS and may be harmful due to sugars worsening diarrhea.  ORS is especially recommended for use in children with diarrhea. As a general guideline for children, replace any new fluid losses from diarrhea and/or vomiting with ORS as follows:   If your child weighs 22 pounds or under (10 kg or less), give 60-120 mL ( -  cup or 2 - 4 ounces) of ORS for each episode of diarrheal stool or vomiting episode.   If your child weighs more than 22 pounds (more than 10 kgs), give 120-240 mL ( - 1 cup or 4 - 8 ounces) of ORS for each diarrheal stool or episode of vomiting.   While correcting for dehydration, children should eat normally. However, foods high in sugar should be avoided because this  may worsen diarrhea. Large amounts of carbonated soft drinks, juice, gelatin desserts and other highly sugared drinks should be avoided.   After correction of dehydration, other liquids that are appealing to the child may be added. Children should drink small amounts of fluids frequently and fluids should be increased as tolerated. Children should drink enough fluids to keep urine clear or pale yellow.   Adults should eat normally while drinking more fluids than usual. Drink small amounts of fluids frequently and increase as tolerated. Drink enough fluids to keep urine clear or pale yellow. Broths, weak decaffeinated tea, lemon lime soft drinks (allowed to go flat) and ORS replace fluids and electrolytes.   Avoid:   Carbonated drinks.   Juice.   Extremely hot or cold fluids.   Caffeine drinks.   Fatty, greasy foods.   Alcohol.   Tobacco.   Too much intake of anything at one time.   Gelatin desserts.   Probiotics are active cultures of beneficial bacteria. They may lessen the amount and number of diarrheal stools in adults. Probiotics can be found in yogurt with active cultures and in supplements.   Wash hands well to avoid spreading bacteria and virus.   Anti-diarrheal medications are not recommended for infants and children.   Only take over-the-counter or prescription medicines for pain, discomfort or fever as directed by your caregiver. Do not give aspirin to children because it may cause Reye's Syndrome.   For adults, ask your caregiver if you should continue all prescribed and over-the-counter medicines.   If your caregiver  has given you a follow-up appointment, it is very important to keep that appointment. Not keeping the appointment could result in a chronic or permanent injury, and disability. If there is any problem keeping the appointment, you must call back to this facility for assistance.  SEEK IMMEDIATE MEDICAL CARE IF:   You or your child is unable to keep fluids  down or other symptoms or problems become worse in spite of treatment.   Vomiting or diarrhea develops and becomes persistent.   There is vomiting of blood or bile (green material).   There is blood in the stool or the stools are black and tarry.   There is no urine output in 6-8 hours or there is only a small amount of very dark urine.   Abdominal pain develops, increases or localizes.   You have a fever.   Your baby is older than 3 months with a rectal temperature of 102 F (38.9 C) or higher.   Your baby is 16 months old or younger with a rectal temperature of 100.4 F (38 C) or higher.   You or your child develops excessive weakness, dizziness, fainting or extreme thirst.   You or your child develops a rash, stiff neck, severe headache or become irritable or sleepy and difficult to awaken.  MAKE SURE YOU:   Understand these instructions.   Will watch your condition.   Will get help right away if you are not doing well or get worse.  Document Released: 05/04/2002 Document Revised: 05/03/2011 Document Reviewed: 03/21/2009 [facility] Patient Information 2012 [place], [place].  Nausea and Vomiting Nausea is a sick feeling that often comes before throwing up (vomiting). Vomiting is a reflex where stomach contents come out of your mouth. Vomiting can cause severe loss of body fluids (dehydration). Children and elderly adults can become dehydrated quickly, especially if they also have diarrhea. Nausea and vomiting are symptoms of a condition or disease. It is important to find the cause of your symptoms. CAUSES   Direct irritation of the stomach lining. This irritation can result from increased acid production (gastroesophageal reflux disease), infection, food poisoning, taking certain medicines (such as nonsteroidal anti-inflammatory drugs), alcohol use, or tobacco use.   Signals from the brain.These signals could be caused by a headache, heat exposure, an inner ear disturbance,  increased pressure in the brain from injury, infection, a tumor, or a concussion, pain, emotional stimulus, or metabolic problems.   An obstruction in the gastrointestinal tract (bowel obstruction).   Illnesses such as diabetes, hepatitis, gallbladder problems, appendicitis, kidney problems, cancer, sepsis, atypical symptoms of a heart attack, or eating disorders.   Medical treatments such as chemotherapy and radiation.   Receiving medicine that makes you sleep (general anesthetic) during surgery.  DIAGNOSIS Your caregiver may ask for tests to be done if the problems do not improve after a few days. Tests may also be done if symptoms are severe or if the reason for the nausea and vomiting is not clear. Tests may include:  Urine tests.   Blood tests.   Stool tests.   Cultures (to look for evidence of infection).   X-rays or other imaging studies.  Test results can help your caregiver make decisions about treatment or the need for additional tests. TREATMENT You need to stay well hydrated. Drink frequently but in small amounts.You may wish to drink water, sports drinks, clear broth, or eat frozen ice pops or gelatin dessert to help stay hydrated.When you eat, eating slowly may help prevent nausea.There  are also some antinausea medicines that may help prevent nausea. HOME CARE INSTRUCTIONS   Take all medicine as directed by your caregiver.   If you do not have an appetite, do not force yourself to eat. However, you must continue to drink fluids.   If you have an appetite, eat a normal diet unless your caregiver tells you differently.   Eat a variety of complex carbohydrates (rice, wheat, potatoes, bread), lean meats, yogurt, fruits, and vegetables.   Avoid high-fat foods because they are more difficult to digest.   Drink enough water and fluids to keep your urine clear or pale yellow.   If you are dehydrated, ask your caregiver for specific rehydration instructions. Signs of  dehydration may include:   Severe thirst.   Dry lips and mouth.   Dizziness.   Dark urine.   Decreasing urine frequency and amount.   Confusion.   Rapid breathing or pulse.  SEEK IMMEDIATE MEDICAL CARE IF:   You have blood or brown flecks (like coffee grounds) in your vomit.   You have black or bloody stools.   You have a severe headache or stiff neck.   You are confused.   You have severe abdominal pain.   You have chest pain or trouble breathing.   You do not urinate at least once every 8 hours.   You develop cold or clammy skin.   You continue to vomit for longer than 24 to 48 hours.   You have a fever.  MAKE SURE YOU:   Understand these instructions.   Will watch your condition.   Will get help right away if you are not doing well or get worse.  Document Released: 05/14/2005 Document Revised: 05/03/2011 Document Reviewed: 10/11/2010 [facility] Patient Information 2012 [place]  Return to the clinic or go to the nearest emergency room if any of your symptoms worsen or new symptoms occur.

## 2015-10-08 ENCOUNTER — Ambulatory Visit (HOSPITAL_COMMUNITY)
Admission: EM | Admit: 2015-10-08 | Discharge: 2015-10-08 | Disposition: A | Discharge status: Home / Self Care | Payer: BLUE CROSS/BLUE SHIELD | Attending: Emergency Medicine | Admitting: Emergency Medicine

## 2015-10-08 ENCOUNTER — Encounter (HOSPITAL_COMMUNITY): Payer: Self-pay | Admitting: Emergency Medicine

## 2015-10-08 DIAGNOSIS — Z041 Encounter for examination and observation following transport accident: Secondary | ICD-10-CM

## 2015-10-08 DIAGNOSIS — M546 Pain in thoracic spine: Secondary | ICD-10-CM

## 2015-10-08 DIAGNOSIS — M542 Cervicalgia: Secondary | ICD-10-CM | POA: Diagnosis not present

## 2015-10-08 DIAGNOSIS — M545 Low back pain, unspecified: Secondary | ICD-10-CM

## 2015-10-08 DIAGNOSIS — Z043 Encounter for examination and observation following other accident: Principal | ICD-10-CM

## 2015-10-08 HISTORY — DX: Essential (primary) hypertension: I10

## 2015-10-08 HISTORY — DX: Type 2 diabetes mellitus without complications: E11.9

## 2015-10-08 MED ORDER — TRAMADOL HCL 50 MG PO TABS: 50.0000 mg | ORAL_TABLET | Freq: Four times a day (QID) | ORAL | Status: AC | PRN
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
## 2015-11-23 ENCOUNTER — Ambulatory Visit (HOSPITAL_COMMUNITY)
Admission: EM | Admit: 2015-11-23 | Discharge: 2015-11-23 | Disposition: A | Discharge status: Home / Self Care | Payer: BLUE CROSS/BLUE SHIELD | Attending: Family Medicine | Admitting: Family Medicine

## 2015-11-23 ENCOUNTER — Encounter (HOSPITAL_COMMUNITY): Payer: Self-pay | Admitting: Emergency Medicine

## 2015-11-23 DIAGNOSIS — M62830 Muscle spasm of back: Secondary | ICD-10-CM

## 2015-11-23 MED ORDER — KETOROLAC TROMETHAMINE 60 MG/2ML IM SOLN
INTRAMUSCULAR | Status: AC
  Filled 2015-11-23: qty 2

## 2015-11-23 MED ORDER — CYCLOBENZAPRINE HCL 5 MG PO TABS: 5.0000 mg | ORAL_TABLET | Freq: Three times a day (TID) | ORAL | Status: AC | PRN

## 2015-11-23 MED ORDER — NAPROXEN 500 MG PO TABS: 500.0000 mg | ORAL_TABLET | Freq: Two times a day (BID) | ORAL | Status: AC

## 2015-11-23 MED ORDER — KETOROLAC TROMETHAMINE 60 MG/2ML IM SOLN
60.0000 mg | Freq: Once | INTRAMUSCULAR | Status: AC
  Administered 2015-11-23: 60 mg via INTRAMUSCULAR

## 2015-11-23 NOTE — ED Provider Notes (Signed)
CSN: [id number]     Arrival date & time 11/23/15  1422 History   None    Chief Complaint  Patient presents with  . Back Pain   (Consider location/radiation/quality/duration/timing/severity/associated sxs/prior Treatment) Patient is a 50 y.o. female presenting with back pain. The history is provided by the patient.  Back Pain Location:  Lumbar spine Quality:  Aching Radiates to:  Does not radiate Pain severity:  Moderate Pain is:  Same all the time Onset quality:  Sudden Duration:  1 day Timing:  Constant Progression:  Worsening Chronicity:  New Relieved by:  Nothing Worsened by:  Nothing tried Ineffective treatments:  None tried   Past Medical History  Diagnosis Date  . Allergy   . Diabetes mellitus without complication (HCC)   . Hypertension    Past Surgical History  Procedure Laterality Date  . Breast surgery    . Cholecystectomy    . Abdominal hysterectomy    . Tubal ligation     Family History  Problem Relation Age of Onset  . Heart disease Mother   . Hyperlipidemia Mother   . Hyperlipidemia Sister   . Hyperlipidemia Brother   . Diabetes Brother    Social History  Substance Use Topics  . Smoking status: Never Smoker   . Smokeless tobacco: None  . Alcohol Use: No   OB History    No data available     Review of Systems  Constitutional: Negative.   HENT: Negative.   Eyes: Negative.   Respiratory: Negative.   Cardiovascular: Negative.   Gastrointestinal: Negative.   Endocrine: Negative.   Genitourinary: Negative.   Musculoskeletal: Positive for back pain.  Skin: Negative.   Allergic/Immunologic: Negative.   Neurological: Negative.   Hematological: Negative.   Psychiatric/Behavioral: Negative.     Allergies  Review of patient's allergies indicates no known allergies.  Home Medications   Prior to Admission medications   Medication Sig Start Date End Date Taking? Authorizing Provider  levothyroxine (SYNTHROID, LEVOTHROID) 50 MCG tablet Take  50 mcg by mouth daily before breakfast.   Yes Historical Provider, MD  lisinopril (PRINIVIL,ZESTRIL) 10 MG tablet Take 10 mg by mouth daily.   Yes Historical Provider, MD  metFORMIN (GLUCOPHAGE) 500 MG tablet Take by mouth 2 (two) times daily with a meal.   Yes Historical Provider, MD  cetirizine (ZYRTEC) 10 MG tablet Take 10 mg by mouth daily.    Historical Provider, MD  cyclobenzaprine (FLEXERIL) 5 MG tablet Take 1 tablet (5 mg total) by mouth 3 (three) times daily as needed for muscle spasms. 11/23/15   Deatra CanterWilliam J Oxford, FNP  HYDROcodone-homatropine [facility](HYCODAN) 5-1.5 MG/5ML syrup Take 5 mLs by mouth every 8 (eight) hours as needed for cough. 10/14/13   Elvina SidleKurt Lauenstein, MD  ibuprofen (ADVIL,MOTRIN) 200 MG tablet Take 200 mg by mouth every 6 (six) hours as needed.    Historical Provider, MD  ibuprofen (ADVIL,MOTRIN) 800 MG tablet Take 800 mg by mouth every 8 (eight) hours as needed.    Historical Provider, MD  naproxen (NAPROSYN) 500 MG tablet Take 1 tablet (500 mg total) by mouth 2 (two) times daily. 11/23/15   Deatra CanterWilliam J Oxford, FNP  ondansetron (ZOFRAN ODT) 4 MG disintegrating tablet Take 1 tablet (4 mg total) by mouth every 8 (eight) hours as needed for nausea or vomiting. 11/04/13   Shade FloodJeffrey R Greene, MD  orlistat (ALLI) 60 MG capsule Take 60 mg by mouth 3 (three) times daily with meals.    Historical Provider, MD  predniSONE (  DELTASONE) 20 MG tablet 2 daily with food 10/14/13   Elvina SidleKurt Lauenstein, MD  tobramycin (TOBREX) 0.3 % ophthalmic solution Place 1 drop into the left eye every 6 (six) hours. 10/14/13   Elvina SidleKurt Lauenstein, MD  traMADol (ULTRAM) 50 MG tablet Take 1 tablet (50 mg total) by mouth every 6 (six) hours as needed. 10/08/15   Servando Salinaatherine H Rossi, NP   Meds Ordered and Administered this Visit   Medications  ketorolac (TORADOL) injection 60 mg (not administered)    BP 115/64 mmHg  Pulse 60  Temp(Src) 98.1 F (36.7 C)  Resp 14  SpO2 100% No data found.   Physical Exam  Constitutional:  She appears well-developed and well-nourished.  HENT:  Head: Normocephalic.  Right Ear: External ear normal.  Left Ear: External ear normal.  Mouth/Throat: Oropharynx is clear and moist.  Eyes: Conjunctivae are normal. Pupils are equal, round, and reactive to light.  Neck: Normal range of motion. Neck supple.  Cardiovascular: Normal rate, regular rhythm and normal heart sounds.   Pulmonary/Chest: Effort normal and breath sounds normal.  Abdominal: Soft. Bowel sounds are normal.  Musculoskeletal: She exhibits tenderness.  Decreased ROM LS spine and TTP left lumbar paraspinous muscles.    ED Course  Procedures (including critical care time)  Labs Review Labs Reviewed - No data to display  Imaging Review No results found.   Visual Acuity Review  Right Eye Distance:   Left Eye Distance:   Bilateral Distance:    Right Eye Near:   Left Eye Near:    Bilateral Near:         MDM   1. Lumbar paraspinal muscle spasm    Naprosyn 500mg  one po bid x 10 days #20 Flexeril 5mg  one po tid prn #30  Toradol 60mg  IM      Deatra CanterWilliam J Oxford, FNP 11/23/15 [phone]

## 2015-11-23 NOTE — Discharge Instructions (Signed)
Back Injury Prevention  Back injuries can be very painful. They can also be difficult to heal. After having one back injury, you are more likely to injure your back again. It is important to learn how to avoid injuring or re-injuring your back. The following tips can help you to prevent a back injury.  WHAT SHOULD I KNOW ABOUT PHYSICAL FITNESS?  · Exercise for 30 minutes per day on most days of the week or as told by your doctor. Make sure to:  ¨ Do aerobic exercises, such as walking, jogging, biking, or swimming.  ¨ Do exercises that increase balance and strength, such as tai chi and yoga.  ¨ Do stretching exercises. This helps with flexibility.  ¨ Try to develop strong belly (abdominal) muscles. Your belly muscles help to support your back.  · Stay at a healthy weight. This helps to decrease your risk of a back injury.  WHAT SHOULD I KNOW ABOUT MY DIET?  · Talk with your doctor about your overall diet. Take supplements and vitamins only as told by your doctor.  · Talk with your doctor about how much calcium and vitamin D you need each day. These nutrients help to prevent weakening of the bones (osteoporosis).  · Include good sources of calcium in your diet, such as:    Dairy products.    Green leafy vegetables.    Products that have had calcium added to them (fortified).  · Include good sources of vitamin D in your diet, such as:    Milk.    Foods that have had vitamin D added to them.  WHAT SHOULD I KNOW ABOUT MY POSTURE?  · Sit up straight and stand up straight. Avoid leaning forward when you sit or hunching over when you stand.  · Choose chairs that have good low-back (lumbar) support.  · If you work at a desk, sit close to it so you do not need to lean over. Keep your chin tucked in. Keep your neck drawn back. Keep your elbows bent so your arms look like the letter "L" (right angle).  · Sit high and close to the steering wheel when you drive. Add a low-back support to your car seat, if needed.  · Avoid sitting  or standing in one position for very long. Take breaks to get up, stretch, and walk around at least one time every hour. Take breaks every hour if you are driving for long periods of time.  · Sleep on your side with your knees slightly bent, or sleep on your back with a pillow under your knees. Do not lie on the front of your body to sleep.  WHAT SHOULD I KNOW ABOUT LIFTING, TWISTING, AND REACHING  Lifting and Heavy Lifting   · Avoid heavy lifting, especially lifting over and over again. If you must do heavy lifting:    Stretch before lifting.    Work slowly.    Rest between lifts.    Use a tool such as a cart or a dolly to move objects if one is available.    Make several small trips instead of carrying one heavy load.    Ask for help when you need it, especially when moving big objects.  · Follow these steps when lifting:    Stand with your feet shoulder-width apart.    Get as close to the object as you can. Do not pick up a heavy object that is far from your body.    Use handles or lifting   as close to the center of your body as possible.  Follow these steps when putting down a heavy load:  Stand with your feet shoulder-width apart.  Lower the object slowly while you tighten the muscles in your legs, belly, and butt. Keep the object as close to the center of your body as possible.  Keep your shoulders back. Keep your chin tucked in. Keep your back straight.  Bend at your knees. Squat down, but keep your heels off the floor.  Use handles or lifting straps if they are available. Twisting and Reaching  Avoid lifting heavy objects above your waist.  Do not twist at your waist while you are lifting or carrying a load. If  you need to turn, move your feet.  Do not bend over without bending at your knees.  Avoid reaching over your head, across a table, or for an object on a high surface.  WHAT ARE SOME OTHER TIPS?  Avoid wet floors and icy ground. Keep sidewalks clear of ice to prevent falls.   Do not sleep on a mattress that is too soft or too hard.   Keep items that you use often within easy reach.   Put heavier objects on shelves at waist level, and put lighter objects on lower or higher shelves.  Find ways to lower your stress, such as:  Exercise.  Massage.  Relaxation techniques.  Talk with your doctor if you feel anxious or depressed. These conditions can make back pain worse.  Wear flat heel shoes with cushioned soles.  Avoid making quick (sudden) movements.  Use both shoulder straps when carrying a backpack.  Do not use any tobacco products, including cigarettes, chewing tobacco, or electronic cigarettes. If you need help quitting, ask your doctor.   This information is not intended to replace advice given to you by your health care provider. Make sure you discuss any questions you have with your health care provider.   Document Released: 10/31/2007 Document Revised: 09/28/2014 Document Reviewed: 05/18/2014 Elsevier Interactive Patient Education [employer].

## 2015-11-23 NOTE — ED Notes (Signed)
[facility] a [facility] of lower back pain that she described as sharp that started this am after getting out of the bed. The patient denied any known injury or dysuria.

## 2019-05-21 ENCOUNTER — Ambulatory Visit: Payer: BLUE CROSS/BLUE SHIELD | Attending: Internal Medicine

## 2019-05-21 DIAGNOSIS — Z20822 Contact with and (suspected) exposure to covid-19: Secondary | ICD-10-CM

## 2019-05-22 LAB — NOVEL CORONAVIRUS, NAA: SARS-CoV-2, NAA: NOT DETECTED

## 2019-10-01 ENCOUNTER — Ambulatory Visit: Payer: BLUE CROSS/BLUE SHIELD | Attending: Internal Medicine

## 2019-10-01 DIAGNOSIS — Z23 Encounter for immunization: Secondary | ICD-10-CM

## 2019-10-01 NOTE — Progress Notes (Signed)
   Covid-19 Vaccination Clinic  Name:  Alayla Dethlefs    MRN: [id number] DOB: [birth year]  10/01/2019  Ms. Jasso was observed post Covid-19 immunization for 15 minutes without incident. She was provided with Vaccine Information Sheet and instruction to access the V-Safe system.   Ms. Bronder was instructed to call 911 with any severe reactions post vaccine: Marland Kitchen Difficulty breathing  . Swelling of face and throat  . A fast heartbeat  . A bad rash all over body  . Dizziness and weakness   Immunizations Administered    Name Date Dose VIS Date Route   Pfizer COVID-19 Vaccine 10/01/2019  3:56 PM 0.3 mL 07/22/2018 Intramuscular   Manufacturer: [employer], [employer]   Lot: [id number]   NDC: [address]-5

## 2019-10-27 ENCOUNTER — Ambulatory Visit: Payer: PRIVATE HEALTH INSURANCE | Attending: Internal Medicine

## 2019-10-27 DIAGNOSIS — Z23 Encounter for immunization: Secondary | ICD-10-CM

## 2019-10-27 NOTE — Progress Notes (Signed)
   Covid-19 Vaccination Clinic  Name:  Madison Huynh    MRN: [id number] DOB: 1965/09/19  10/27/2019  Madison Huynh was observed post Covid-19 immunization for 15 minutes without incident. She was provided with Vaccine Information Sheet and instruction to access the V-Safe system.   Madison Huynh was instructed to call 911 with any severe reactions post vaccine: Marland Kitchen Difficulty breathing  . Swelling of face and throat  . A fast heartbeat  . A bad rash all over body  . Dizziness and weakness   Immunizations Administered    Name Date Dose VIS Date Route   Pfizer COVID-19 Vaccine 10/27/2019  1:08 PM 0.3 mL 07/22/2018 Intramuscular   Manufacturer: [employer], [employer]   Lot: [id number]   NDC: [address]-6

## 2020-06-04 ENCOUNTER — Ambulatory Visit (HOSPITAL_COMMUNITY)
Admission: EM | Admit: 2020-06-04 | Discharge: 2020-06-04 | Disposition: A | Discharge status: Home / Self Care | Payer: BLUE CROSS/BLUE SHIELD | Attending: Urgent Care | Admitting: Urgent Care

## 2020-06-04 ENCOUNTER — Encounter (HOSPITAL_COMMUNITY): Payer: Self-pay | Admitting: Emergency Medicine

## 2020-06-04 DIAGNOSIS — J111 Influenza due to unidentified influenza virus with other respiratory manifestations: Secondary | ICD-10-CM

## 2020-06-04 DIAGNOSIS — U071 COVID-19: Secondary | ICD-10-CM | POA: Insufficient documentation

## 2020-06-04 MED ORDER — PSEUDOEPHEDRINE HCL 30 MG PO TABS: 30.0000 mg | ORAL_TABLET | Freq: Three times a day (TID) | ORAL | 0 refills | Status: AC | PRN

## 2020-06-04 MED ORDER — CETIRIZINE HCL 10 MG PO TABS: 10.0000 mg | ORAL_TABLET | Freq: Every day | ORAL | 0 refills | Status: AC

## 2020-06-04 MED ORDER — OSELTAMIVIR PHOSPHATE 75 MG PO CAPS
75.0000 mg | ORAL_CAPSULE | Freq: Two times a day (BID) | ORAL | 0 refills | Status: AC
Start: 2020-06-04 — End: ?

## 2020-06-04 NOTE — ED Triage Notes (Signed)
Pt presents with sore throat, headache, body aches and chills xs 2 days.

## 2020-06-04 NOTE — Discharge Instructions (Signed)

## 2020-06-04 NOTE — ED Provider Notes (Signed)
[facility]   MRN: [id number] DOB: 03/17/1966  Subjective:   Spenser Cong is a 55 y.o. female presenting for acute onset this morning of sore throat, headache, body aches, chills.  Patient is vaccinated against COVID-19 and influenza.  Denies chest pain, shortness of breath, coughing.  No current facility-administered medications for this encounter.  Current Outpatient Medications:  .  cetirizine (ZYRTEC) 10 MG tablet, Take 10 mg by mouth daily., Disp: , Rfl:  .  cyclobenzaprine (FLEXERIL) 5 MG tablet, Take 1 tablet (5 mg total) by mouth 3 (three) times daily as needed for muscle spasms., Disp: 30 tablet, Rfl: 0 .  HYDROcodone-homatropine (HYCODAN) 5-1.5 MG/5ML syrup, Take 5 mLs by mouth every 8 (eight) hours as needed for cough., Disp: 120 mL, Rfl: 0 .  ibuprofen (ADVIL,MOTRIN) 200 MG tablet, Take 200 mg by mouth every 6 (six) hours as needed., Disp: , Rfl:  .  ibuprofen (ADVIL,MOTRIN) 800 MG tablet, Take 800 mg by mouth every 8 (eight) hours as needed., Disp: , Rfl:  .  levothyroxine (SYNTHROID, LEVOTHROID) 50 MCG tablet, Take 50 mcg by mouth daily before breakfast., Disp: , Rfl:  .  lisinopril (PRINIVIL,ZESTRIL) 10 MG tablet, Take 10 mg by mouth daily., Disp: , Rfl:  .  metFORMIN (GLUCOPHAGE) 500 MG tablet, Take by mouth 2 (two) times daily with a meal., Disp: , Rfl:  .  naproxen (NAPROSYN) 500 MG tablet, Take 1 tablet (500 mg total) by mouth 2 (two) times daily., Disp: 20 tablet, Rfl: 0 .  ondansetron (ZOFRAN ODT) 4 MG disintegrating tablet, Take 1 tablet (4 mg total) by mouth every 8 (eight) hours as needed for nausea or vomiting., Disp: 10 tablet, Rfl: 0 .  orlistat (ALLI) 60 MG capsule, Take 60 mg by mouth 3 (three) times daily with meals., Disp: , Rfl:  .  predniSONE (DELTASONE) 20 MG tablet, 2 daily with food, Disp: 10 tablet, Rfl: 1 .  tobramycin (TOBREX) 0.3 % ophthalmic solution, Place 1 drop into the left eye every 6 (six) hours., Disp: 5 mL, Rfl: 0 .   traMADol (ULTRAM) 50 MG tablet, Take 1 tablet (50 mg total) by mouth every 6 (six) hours as needed., Disp: 15 tablet, Rfl: 0   No Known Allergies  Past Medical History:  Diagnosis Date  . Allergy   . Diabetes mellitus without complication (HCC)   . Hypertension      Past Surgical History:  Procedure Laterality Date  . ABDOMINAL HYSTERECTOMY    . BREAST SURGERY    . CHOLECYSTECTOMY    . TUBAL LIGATION      Family History  Problem Relation Age of Onset  . Heart disease Mother   . Hyperlipidemia Mother   . Hyperlipidemia Sister   . Hyperlipidemia Brother   . Diabetes Brother     Social History   Tobacco Use  . Smoking status: Never Smoker  Substance Use Topics  . Alcohol use: No  . Drug use: No    ROS   Objective:   Vitals: BP (!) 161/89 (BP Location: Right Arm)   Pulse 92   Temp 100.2 F (37.9 C) (Oral)   Resp 18   SpO2 98%   Physical Exam Constitutional:      General: She is not in acute distress.    Appearance: Normal appearance. She is well-developed. She is not ill-appearing, toxic-appearing or diaphoretic.  HENT:     Head: Normocephalic and atraumatic.     Nose: Nose normal.  Mouth/Throat:     Mouth: Mucous membranes are moist.  Eyes:     Extraocular Movements: Extraocular movements intact.     Pupils: Pupils are equal, round, and reactive to light.  Cardiovascular:     Rate and Rhythm: Normal rate and regular rhythm.     Pulses: Normal pulses.     Heart sounds: Normal heart sounds. No murmur heard. No friction rub. No gallop.   Pulmonary:     Effort: Pulmonary effort is normal. No respiratory distress.     Breath sounds: Normal breath sounds. No stridor. No wheezing, rhonchi or rales.  Skin:    General: Skin is warm and dry.     Findings: No rash.  Neurological:     Mental Status: She is alert and oriented to person, place, and time.  Psychiatric:        Mood and Affect: Mood normal.        Behavior: Behavior normal.        Thought  Content: Thought content normal.     Assessment and Plan :   PDMP not reviewed this encounter.  1. Influenza-like illness     Recommended she start Tamiflu for clinical diagnosis of influenza, otherwise use supportive care otherwise. COVID testing pending. Counseled patient on potential for adverse effects with medications prescribed/recommended today, ER and return-to-clinic precautions discussed, patient verbalized understanding.    Wallis Bamberg, PA-C 06/04/20 1710

## 2020-06-05 LAB — SARS CORONAVIRUS 2 (TAT 6-24 HRS): SARS Coronavirus 2: POSITIVE — AB

## 2021-04-26 ENCOUNTER — Other Ambulatory Visit: Payer: Self-pay

## 2021-04-26 ENCOUNTER — Emergency Department (HOSPITAL_COMMUNITY)
Admission: EM | Admit: 2021-04-26 | Discharge: 2021-04-27 | Disposition: A | Discharge status: Home / Self Care | Payer: BLUE CROSS/BLUE SHIELD | Attending: Emergency Medicine | Admitting: Emergency Medicine

## 2021-04-26 ENCOUNTER — Encounter (HOSPITAL_COMMUNITY): Payer: Self-pay

## 2021-04-26 DIAGNOSIS — Z79899 Other long term (current) drug therapy: Secondary | ICD-10-CM | POA: Diagnosis not present

## 2021-04-26 DIAGNOSIS — Z20822 Contact with and (suspected) exposure to covid-19: Secondary | ICD-10-CM | POA: Insufficient documentation

## 2021-04-26 DIAGNOSIS — Z7984 Long term (current) use of oral hypoglycemic drugs: Secondary | ICD-10-CM | POA: Diagnosis not present

## 2021-04-26 DIAGNOSIS — E119 Type 2 diabetes mellitus without complications: Secondary | ICD-10-CM | POA: Insufficient documentation

## 2021-04-26 DIAGNOSIS — R519 Headache, unspecified: Secondary | ICD-10-CM | POA: Diagnosis present

## 2021-04-26 DIAGNOSIS — I10 Essential (primary) hypertension: Secondary | ICD-10-CM | POA: Diagnosis not present

## 2021-04-26 LAB — CBC WITH DIFFERENTIAL/PLATELET
Abs Immature Granulocytes: 0.03 10*3/uL (ref 0.00–0.07)
Basophils Absolute: 0 10*3/uL (ref 0.0–0.1)
Basophils Relative: 0 %
Eosinophils Absolute: 0.1 10*3/uL (ref 0.0–0.5)
Eosinophils Relative: 1 %
HCT: 40.3 % (ref 36.0–46.0)
Hemoglobin: 12.9 g/dL (ref 12.0–15.0)
Immature Granulocytes: 0 %
Lymphocytes Relative: 8 %
Lymphs Abs: 0.6 10*3/uL — ABNORMAL LOW (ref 0.7–4.0)
MCH: 27.9 pg (ref 26.0–34.0)
MCHC: 32 g/dL (ref 30.0–36.0)
MCV: 87.2 fL (ref 80.0–100.0)
Monocytes Absolute: 0.5 10*3/uL (ref 0.1–1.0)
Monocytes Relative: 7 %
Neutro Abs: 6.7 10*3/uL (ref 1.7–7.7)
Neutrophils Relative %: 84 %
Platelets: 243 10*3/uL (ref 150–400)
RBC: 4.62 MIL/uL (ref 3.87–5.11)
RDW: 13.5 % (ref 11.5–15.5)
WBC: 8 10*3/uL (ref 4.0–10.5)
nRBC: 0 % (ref 0.0–0.2)

## 2021-04-26 LAB — BASIC METABOLIC PANEL
Anion gap: 7 (ref 5–15)
BUN: 19 mg/dL (ref 6–20)
CO2: 25 mmol/L (ref 22–32)
Calcium: 8.8 mg/dL — ABNORMAL LOW (ref 8.9–10.3)
Chloride: 103 mmol/L (ref 98–111)
Creatinine, Ser: 0.64 mg/dL (ref 0.44–1.00)
GFR, Estimated: >60 mL/min (ref >60)
Glucose, Bld: 136 mg/dL — ABNORMAL HIGH (ref 70–99)
Potassium: 4.5 mmol/L (ref 3.5–5.1)
Sodium: 135 mmol/L (ref 135–145)

## 2021-04-26 LAB — RESP PANEL BY RT-PCR (FLU A&B, COVID) ARPGX2
Influenza A by PCR: NEGATIVE
Influenza B by PCR: NEGATIVE
SARS Coronavirus 2 by RT PCR: NEGATIVE

## 2021-04-26 NOTE — ED Triage Notes (Signed)
Pt states she has been taking her BP meds as prescribed. Pt c/o being hypertensive at home. BP reading at home 147/93. Pt c/o headaches.

## 2021-04-26 NOTE — ED Provider Notes (Signed)
Emergency Medicine Provider Triage Evaluation Note  Madison Huynh , a 55 y.o. female  was evaluated in triage.  Pt complains of HTN, measured 141/97. Headaches and chest pains on and off. URI sx as well  Review of Systems  Positive: Diaphoresis,  Negative:   Physical Exam  BP (!) 155/93 (BP Location: Right Arm)   Pulse 88   Temp 98.7 F (37.1 C) (Oral)   Resp 17   Ht 5\' 2"  (1.575 m)   Wt 92.1 kg   SpO2 98%   BMI 37.13 kg/m  Gen:   Awake, no distress   Resp:  Normal effort  MSK:   Moves extremities without difficulty  Other:    Medical Decision Making  Medically screening exam initiated at 9:07 PM.  Appropriate orders placed.  Madison Huynh was informed that the remainder of the evaluation will be completed by another provider, this initial triage assessment does not replace that evaluation, and the importance of remaining in the ED until their evaluation is complete.     Madison Sicilian, PA-C 04/27/21 1202    1203, MD 04/29/21 [phone]

## 2021-04-27 ENCOUNTER — Emergency Department (HOSPITAL_COMMUNITY): Payer: BLUE CROSS/BLUE SHIELD

## 2021-04-27 MED ORDER — LABETALOL HCL 5 MG/ML IV SOLN: 5.0000 mg | Freq: Once | INTRAVENOUS | Status: DC

## 2021-04-27 MED ORDER — IBUPROFEN 200 MG PO TABS
600.0000 mg | ORAL_TABLET | Freq: Once | ORAL | Status: AC
  Administered 2021-04-27: 600 mg via ORAL
  Filled 2021-04-27: qty 3

## 2021-04-27 MED ORDER — AMLODIPINE BESYLATE 5 MG PO TABS
5.0000 mg | ORAL_TABLET | Freq: Once | ORAL | Status: AC
  Administered 2021-04-27: 5 mg via ORAL
  Filled 2021-04-27: qty 1

## 2021-04-27 MED ORDER — ACETAMINOPHEN 325 MG PO TABS
650.0000 mg | ORAL_TABLET | Freq: Once | ORAL | Status: AC
  Administered 2021-04-27: 650 mg via ORAL
  Filled 2021-04-27: qty 2

## 2021-04-27 NOTE — ED Notes (Signed)
Patient transported to CT 

## 2021-04-27 NOTE — Discharge Instructions (Addendum)
Check your blood pressure in the morning.  If it is greater than 140/80 then take a double dose of your Zestril.  Call your primary doctor to schedule an appointment for recheck of blood pressure.

## 2021-04-27 NOTE — ED Provider Notes (Signed)
[facility]-EMERGENCY DEPT Provider Note   CSN: [id number] Arrival date & time: 04/26/21  2023     History Chief Complaint  Patient presents with   Hypertension   Headache    Madison Huynh is a 55 y.o. female.  Patient presents to the emergency department for evaluation of headache.  Patient reports that she awakened this morning with a diffuse headache across her forehead.  She cannot describe the pain but it does worsen when she stands up and walks.  No associated numbness, tingling or weakness of extremities.  She has felt some flushing and warmth at times but has not taken her temperature.  She has not had any cough, cold symptoms, nausea, vomiting or diarrhea.  No neck pain or stiffness.  Patient did take her blood pressure because of the headache and it has been elevated through the day.      Past Medical History:  Diagnosis Date   Allergy    Diabetes mellitus without complication (HCC)    Hypertension     There are no problems to display for this patient.   Past Surgical History:  Procedure Laterality Date   ABDOMINAL HYSTERECTOMY     BREAST SURGERY     CHOLECYSTECTOMY     TUBAL LIGATION       OB History   No obstetric history on file.     Family History  Problem Relation Age of Onset   Heart disease Mother    Hyperlipidemia Mother    Hyperlipidemia Sister    Hyperlipidemia Brother    Diabetes Brother     Social History   Tobacco Use   Smoking status: Never  Substance Use Topics   Alcohol use: No   Drug use: No    Home Medications Prior to Admission medications   Medication Sig Start Date End Date Taking? Authorizing Provider  cetirizine (ZYRTEC) 10 MG tablet Take 1 tablet (10 mg total) by mouth daily. 06/04/20   Wallis Bamberg, PA-C  cyclobenzaprine (FLEXERIL) 5 MG tablet Take 1 tablet (5 mg total) by mouth 3 (three) times daily as needed for muscle spasms. 11/23/15   Deatra Canter, FNP  HYDROcodone-homatropine (HYCODAN)  5-1.5 MG/5ML syrup Take 5 mLs by mouth every 8 (eight) hours as needed for cough. 10/14/13   Elvina Sidle, MD  ibuprofen (ADVIL,MOTRIN) 200 MG tablet Take 200 mg by mouth every 6 (six) hours as needed.    [provider]  ibuprofen (ADVIL,MOTRIN) 800 MG tablet Take 800 mg by mouth every 8 (eight) hours as needed.    [provider]  levothyroxine (SYNTHROID, LEVOTHROID) 50 MCG tablet Take 50 mcg by mouth daily before breakfast.    [provider]  lisinopril (PRINIVIL,ZESTRIL) 10 MG tablet Take 10 mg by mouth daily.    [provider]  metFORMIN (GLUCOPHAGE) 500 MG tablet Take by mouth 2 (two) times daily with a meal.    [provider]  naproxen (NAPROSYN) 500 MG tablet Take 1 tablet (500 mg total) by mouth 2 (two) times daily. 11/23/15   Deatra Canter, FNP  ondansetron (ZOFRAN ODT) 4 MG disintegrating tablet Take 1 tablet (4 mg total) by mouth every 8 (eight) hours as needed for nausea or vomiting. 11/04/13   Shade Flood, MD  orlistat (ALLI) 60 MG capsule Take 60 mg by mouth 3 (three) times daily with meals.    [provider]  oseltamivir (TAMIFLU) 75 MG capsule Take 1 capsule (75 mg total) by mouth 2 (  two) times daily. 06/04/20   Wallis Bamberg, PA-C  predniSONE (DELTASONE) 20 MG tablet 2 daily with food 10/14/13   Elvina Sidle, MD  pseudoephedrine (SUDAFED) 30 MG tablet Take 1 tablet (30 mg total) by mouth every 8 (eight) hours as needed for congestion. 06/04/20   Wallis Bamberg, PA-C  tobramycin (TOBREX) 0.3 % ophthalmic solution Place 1 drop into the left eye every 6 (six) hours. 10/14/13   Elvina Sidle, MD  traMADol (ULTRAM) 50 MG tablet Take 1 tablet (50 mg total) by mouth every 6 (six) hours as needed. 10/08/15   Servando Salina, NP    Allergies    Patient has no known allergies.  Review of Systems   Review of Systems  Neurological:  Positive for headaches.  All other systems reviewed and are negative.  Physical  Exam Updated Vital Signs BP 138/81   Pulse 81   Temp 99.2 F (37.3 C) (Oral)   Resp 15   Ht 5\' 2"  (1.575 m)   Wt 92.1 kg   SpO2 100%   BMI 37.13 kg/m   Physical Exam Vitals and nursing note reviewed.  Constitutional:      General: She is not in acute distress.    Appearance: Normal appearance. She is well-developed.  HENT:     Head: Normocephalic and atraumatic.     Right Ear: Hearing normal.     Left Ear: Hearing normal.     Nose: Nose normal.  Eyes:     Conjunctiva/sclera: Conjunctivae normal.     Pupils: Pupils are equal, round, and reactive to light.  Cardiovascular:     Rate and Rhythm: Regular rhythm.     Heart sounds: S1 normal and S2 normal. No murmur heard.   No friction rub. No gallop.  Pulmonary:     Effort: Pulmonary effort is normal. No respiratory distress.     Breath sounds: Normal breath sounds.  Chest:     Chest wall: No tenderness.  Abdominal:     General: Bowel sounds are normal.     Palpations: Abdomen is soft.     Tenderness: There is no abdominal tenderness. There is no guarding or rebound. Negative signs include Murphy's sign and McBurney's sign.     Hernia: No hernia is present.  Musculoskeletal:        General: Normal range of motion.     Cervical back: Normal range of motion and neck supple.  Skin:    General: Skin is warm and dry.     Findings: No rash.  Neurological:     Mental Status: She is alert and oriented to person, place, and time.     GCS: GCS eye subscore is 4. GCS verbal subscore is 5. GCS motor subscore is 6.     Cranial Nerves: No cranial nerve deficit.     Sensory: No sensory deficit.     Coordination: Coordination normal.  Psychiatric:        Speech: Speech normal.        Behavior: Behavior normal.        Thought Content: Thought content normal.    ED Results / Procedures / Treatments   Labs (all labs ordered are listed, but only abnormal results are displayed) Labs Reviewed  CBC WITH DIFFERENTIAL/PLATELET -  Abnormal; Notable for the following components:      Result Value   Lymphs Abs 0.6 (*)    All other components within normal limits  BASIC METABOLIC PANEL - Abnormal; Notable for the following  components:   Glucose, Bld 136 (*)    Calcium 8.8 (*)    All other components within normal limits  RESP PANEL BY RT-PCR (FLU A&B, COVID) [id number]    EKG None  Radiology CT HEAD WO CONTRAST ( )  Result Date: 04/27/2021 CLINICAL DATA:  Headache EXAM: CT HEAD WITHOUT CONTRAST TECHNIQUE: Contiguous axial images were obtained from the base of the skull through the vertex without intravenous contrast. COMPARISON:  None. FINDINGS: Brain: There is no mass, hemorrhage or extra-axial collection. The size and configuration of the ventricles and extra-axial CSF spaces are normal. The brain parenchyma is normal, without acute or chronic infarction. Partially empty sella turcica. Vascular: No abnormal hyperdensity of the major intracranial arteries or dural venous sinuses. No intracranial atherosclerosis. Skull: The visualized skull base, calvarium and extracranial soft tissues are normal. Sinuses/Orbits: No fluid levels or advanced mucosal thickening of the visualized paranasal sinuses. No mastoid or middle ear effusion. The orbits are normal. IMPRESSION: 1. No acute intracranial abnormality. Electronically Signed   By: Deatra Robinson M.D.   On: 04/27/2021 02:00    Procedures Procedures   Medications Ordered in ED Medications  amLODipine (NORVASC) tablet 5 mg (5 mg Oral Given 04/27/21 0153)    ED Course  I have reviewed the triage vital signs and the nursing notes.  Pertinent labs & imaging results that were available during my care of the patient were reviewed by me and considered in my medical decision making (see chart for details).    MDM Rules/Calculators/A&P                           Patient presents to the emergency department for evaluation of headache.  Patient woke with a headache this morning  and it has been present throughout the day.  She does report that the headache worsens when she stands and walks.  She denies any injury.  She has a normal neurologic exam.  No neck pain, neck stiffness or fever.  Patient noted elevated blood pressure at home and it was persistently elevated at arrival.  Patient given Norvasc and her blood pressure has improved.  Headache has mostly resolved with blood pressure improvement.  Will provide prescription for Norvasc.  Patient is to check her blood pressure before taking the Norvasc each day.  Schedule follow-up with primary doctor as soon as possible for definitive blood pressure treatment.  Final Clinical Impression(s) / ED Diagnoses Final diagnoses:  Primary hypertension    Rx / DC Orders ED Discharge Orders     None        Cesily Cuoco, Canary Brim, MD 04/27/21 [phone]

## 2021-04-27 NOTE — ED Notes (Signed)
Pt returned from CT °

## 2021-05-23 ENCOUNTER — Encounter (HOSPITAL_COMMUNITY): Payer: Self-pay

## 2021-05-23 ENCOUNTER — Ambulatory Visit (HOSPITAL_COMMUNITY)
Admission: EM | Admit: 2021-05-23 | Discharge: 2021-05-23 | Disposition: A | Discharge status: Home / Self Care | Payer: BLUE CROSS/BLUE SHIELD | Attending: Family Medicine | Admitting: Family Medicine

## 2021-05-23 DIAGNOSIS — K529 Noninfective gastroenteritis and colitis, unspecified: Secondary | ICD-10-CM

## 2021-05-23 MED ORDER — OMEPRAZOLE 20 MG PO CPDR: 20.0000 mg | DELAYED_RELEASE_CAPSULE | Freq: Every day | ORAL | 0 refills | Status: AC

## 2021-05-23 MED ORDER — ONDANSETRON 4 MG PO TBDP: 4.0000 mg | ORAL_TABLET | Freq: Three times a day (TID) | ORAL | 0 refills | Status: AC | PRN

## 2021-05-23 NOTE — ED Triage Notes (Signed)
Pt presents with c/o n/v/d and headaches x 2 days.    States she has not taken medicine at home for relief.

## 2021-05-23 NOTE — ED Provider Notes (Signed)
[facility]    CSN: [id number] Arrival date & time: 05/23/21  1943      History   Chief Complaint Chief Complaint  Patient presents with   Abdominal Pain   Nausea   Vomiting   Headache    HPI Madison Huynh is a 55 y.o. female.    Abdominal Pain Headache Associated symptoms: abdominal pain   Here for h/o nausea since last evening. She had a little stomach pain then, but was able to sleep all night. This AM had some nausea. After work this evening, she ate a hamburger and drank a lemonade. Then had stomach pain in her epigastric area, and has thrown up 2 times and had diarrhea 3 times. No blood in either.  Did have some h/a  No f/c/URI symptoms. No dysuira.  Takes meds for HTN  Past Medical History:  Diagnosis Date   Allergy    Diabetes mellitus without complication (HCC)    Hypertension     There are no problems to display for this patient.   Past Surgical History:  Procedure Laterality Date   ABDOMINAL HYSTERECTOMY     BREAST SURGERY     CHOLECYSTECTOMY     TUBAL LIGATION      OB History   No obstetric history on file.      Home Medications    Prior to Admission medications   Medication Sig Start Date End Date Taking? Authorizing Provider  omeprazole (PRILOSEC) 20 MG capsule Take 1 capsule (20 mg total) by mouth daily. 05/23/21  Yes Zenia Resides, MD  ondansetron (ZOFRAN-ODT) 4 MG disintegrating tablet Take 1 tablet (4 mg total) by mouth every 8 (eight) hours as needed for nausea or vomiting. 05/23/21  Yes Zenia Resides, MD  cetirizine (ZYRTEC) 10 MG tablet Take 1 tablet (10 mg total) by mouth daily. 06/04/20   Wallis Bamberg, PA-C  cyclobenzaprine (FLEXERIL) 5 MG tablet Take 1 tablet (5 mg total) by mouth 3 (three) times daily as needed for muscle spasms. 11/23/15   Deatra Canter, FNP  HYDROcodone-homatropine (HYCODAN) 5-1.5 MG/5ML syrup Take 5 mLs by mouth every 8 (eight) hours as needed for cough. 10/14/13   Elvina Sidle, MD   ibuprofen (ADVIL,MOTRIN) 200 MG tablet Take 200 mg by mouth every 6 (six) hours as needed.    [provider]  ibuprofen (ADVIL,MOTRIN) 800 MG tablet Take 800 mg by mouth every 8 (eight) hours as needed.    [provider]  levothyroxine (SYNTHROID, LEVOTHROID) 50 MCG tablet Take 50 mcg by mouth daily before breakfast.    [provider]  lisinopril (PRINIVIL,ZESTRIL) 10 MG tablet Take 10 mg by mouth daily.    [provider]  metFORMIN (GLUCOPHAGE) 500 MG tablet Take by mouth 2 (two) times daily with a meal.    [provider]  naproxen (NAPROSYN) 500 MG tablet Take 1 tablet (500 mg total) by mouth 2 (two) times daily. 11/23/15   Deatra Canter, FNP  orlistat (ALLI) 60 MG capsule Take 60 mg by mouth 3 (three) times daily with meals.    [provider]  oseltamivir (TAMIFLU) 75 MG capsule Take 1 capsule (75 mg total) by mouth 2 (two) times daily. 06/04/20   Wallis Bamberg, PA-C  predniSONE (DELTASONE) 20 MG tablet 2 daily with food 10/14/13   Elvina Sidle, MD  pseudoephedrine (SUDAFED) 30 MG tablet Take 1 tablet (30 mg total) by mouth every 8 (eight) hours as needed for congestion. 06/04/20   Urban Gibson,  Marquita Palms, PA-C  tobramycin (TOBREX) 0.3 % ophthalmic solution Place 1 drop into the left eye every 6 (six) hours. 10/14/13   Elvina Sidle, MD  traMADol (ULTRAM) 50 MG tablet Take 1 tablet (50 mg total) by mouth every 6 (six) hours as needed. 10/08/15   Servando Salina, NP    Family History Family History  Problem Relation Age of Onset   Heart disease Mother    Hyperlipidemia Mother    Hyperlipidemia Sister    Hyperlipidemia Brother    Diabetes Brother     Social History Social History   Tobacco Use   Smoking status: Never  Substance Use Topics   Alcohol use: No   Drug use: No     Allergies   Patient has no known allergies.   Review of Systems Review of Systems  Gastrointestinal:  Positive for abdominal pain.  Neurological:   Positive for headaches.    Physical Exam Triage Vital Signs ED Triage Vitals  Enc Vitals Group     BP 05/23/21 1951 111/83     Pulse Rate 05/23/21 1950 86     Resp 05/23/21 1950 18     Temp 05/23/21 1950 98.4 F (36.9 C)     Temp Source 05/23/21 1950 Oral     SpO2 05/23/21 1950 100 %     Weight --      Height --      Head Circumference --      Peak Flow --      Pain Score 05/23/21 1949 0     Pain Loc --      Pain Edu? --      Excl. in GC? --    No data found.  Updated Vital Signs BP 111/83    Pulse 86    Temp 98.4 F (36.9 C) (Oral)    Resp 18    SpO2 100%   Visual Acuity Right Eye Distance:   Left Eye Distance:   Bilateral Distance:    Right Eye Near:   Left Eye Near:    Bilateral Near:     Physical Exam Vitals reviewed.  Constitutional:      General: She is not in acute distress.    Appearance: She is not toxic-appearing.  HENT:     Mouth/Throat:     Mouth: Mucous membranes are moist.  Eyes:     Extraocular Movements: Extraocular movements intact.     Pupils: Pupils are equal, round, and reactive to light.  Cardiovascular:     Rate and Rhythm: Normal rate and regular rhythm.     Heart sounds: No murmur heard. Pulmonary:     Effort: Pulmonary effort is normal.     Breath sounds: Normal breath sounds.  Abdominal:     General: There is no distension.     Palpations: Abdomen is soft. There is no mass.     Tenderness: There is no abdominal tenderness. There is no guarding.  Musculoskeletal:     Cervical back: Neck supple.  Lymphadenopathy:     Cervical: No cervical adenopathy.  Skin:    Capillary Refill: Capillary refill takes less than 2 seconds.     Coloration: Skin is not jaundiced or pale.  Neurological:     General: No focal deficit present.     Mental Status: She is alert and oriented to person, place, and time.  Psychiatric:        Behavior: Behavior normal.     UC Treatments / Results  Labs (all  labs ordered are listed, but only  abnormal results are displayed) Labs Reviewed - No data to display  EKG   Radiology No results found.  Procedures Procedures (including critical care time)  Medications Ordered in UC Medications - No data to display  Initial Impression / Assessment and Plan / UC Course  I have reviewed the triage vital signs and the nursing notes.  Pertinent labs & imaging results that were available during my care of the patient were reviewed by me and considered in my medical decision making (see chart for details).     I suspect this is a gastroenteritis, and was made worse by the heavy food with acidic drink. Zofran for the n/v. Omeprazole for gastritis?, but also can use maalox Final Clinical Impressions(s) / UC Diagnoses   Final diagnoses:  Gastroenteritis     Discharge Instructions      Take ondansetron dissolved in your mouth every 8 hours as needed for nausea or vomiting (tome ondansetron, dissuelta en la boca, cada 8 horas si necesita para azco o vomito).  Vayase a la sala de urgencia si esta empeorando     ED Prescriptions     Medication Sig Dispense Auth. Provider   ondansetron (ZOFRAN-ODT) 4 MG disintegrating tablet Take 1 tablet (4 mg total) by mouth every 8 (eight) hours as needed for nausea or vomiting. 10 tablet Zenia Resides, MD   omeprazole (PRILOSEC) 20 MG capsule Take 1 capsule (20 mg total) by mouth daily. 14 capsule Marlinda Mike, Janace Aris, MD      PDMP not reviewed this encounter.   Zenia Resides, MD 05/23/21 [id number]

## 2021-05-23 NOTE — Discharge Instructions (Addendum)
Take ondansetron dissolved in your mouth every 8 hours as needed for nausea or vomiting (tome ondansetron, dissuelta en la boca, cada 8 horas si necesita para azco o vomito).  Vayase a la sala de urgencia si esta empeorando(go to the ER if worsening)

## 2023-08-22 IMAGING — CT CT HEAD W/O CM
3 of 4 series · 16 of 47 positions shown, 19 images · non-contrast
Comparison: None.

CLINICAL DATA: Headache

EXAM:
CT HEAD WITHOUT CONTRAST
TECHNIQUE: Contiguous axial images were obtained from the base of the skull
through the vertex without intravenous contrast.

[Series 2: head wo · axial · 0.47mm/px · z∈[-198,-73]mm · 10 of 31 slices shown, 13 images]
[im 3/31  brain]
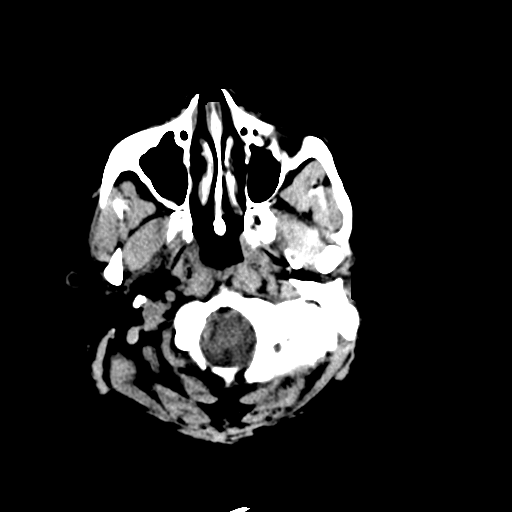
[im 3/31  bone]
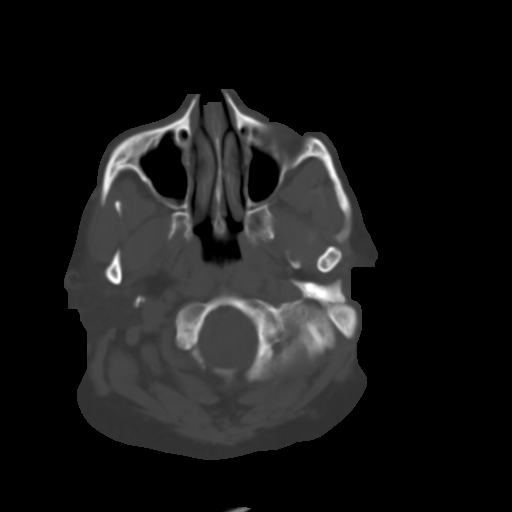
[im 6/31  brain]
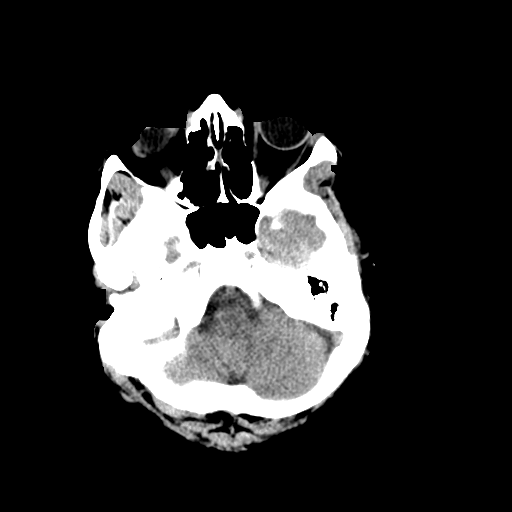
[im 9/31  brain]
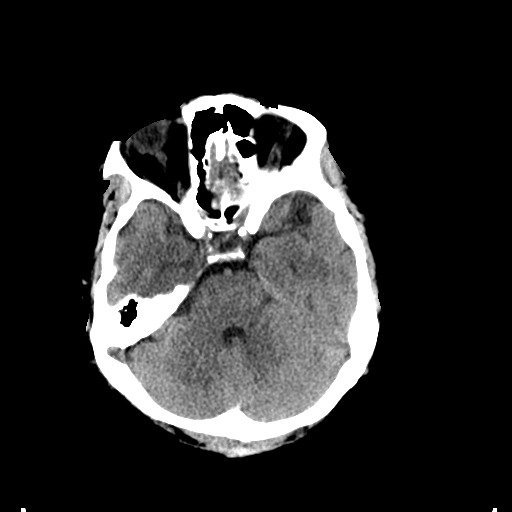
[im 11/31  brain]
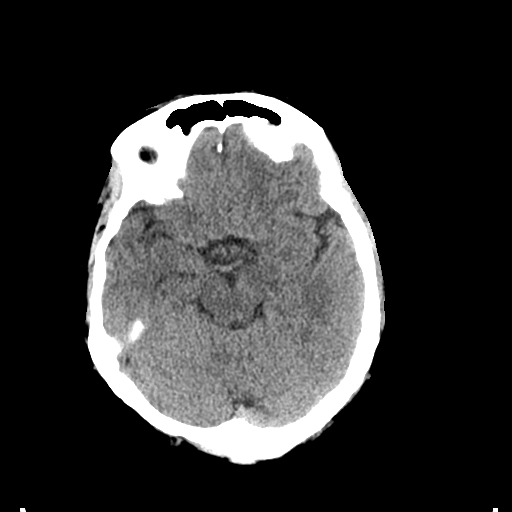
[im 14/31  brain]
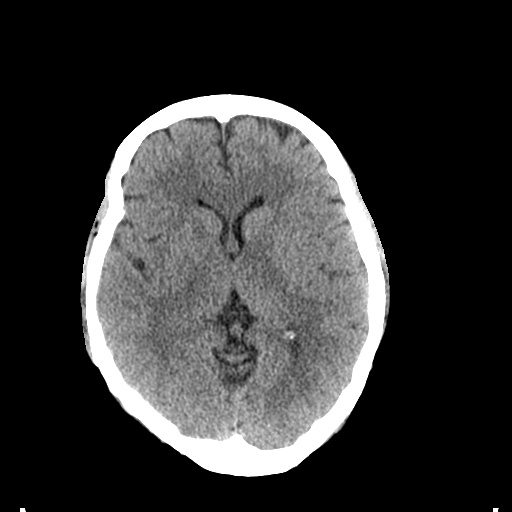
[im 14/31  bone]
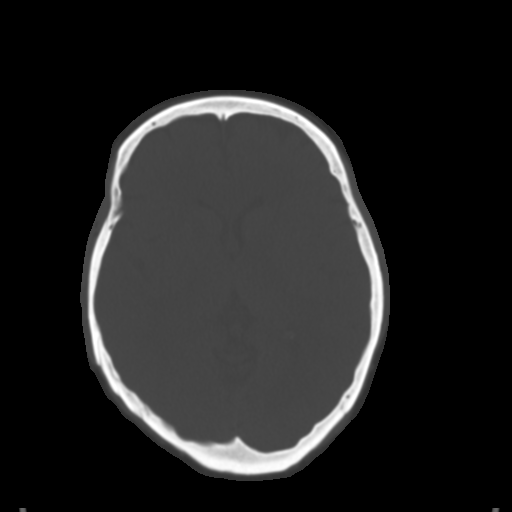
[im 17/31  brain]
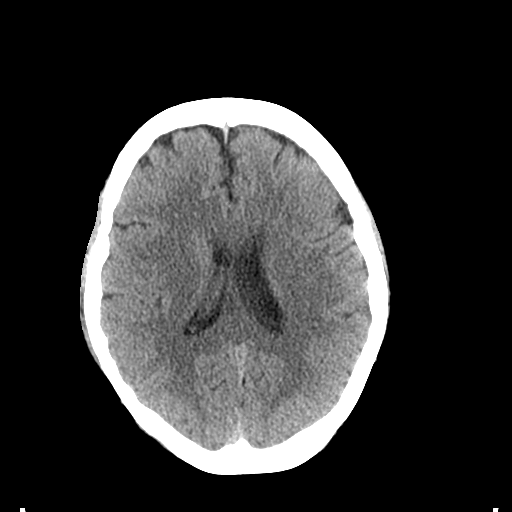
[im 20/31  brain]
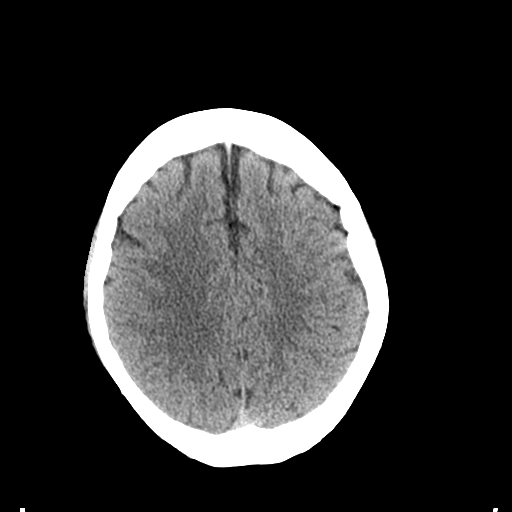
[im 23/31  brain]
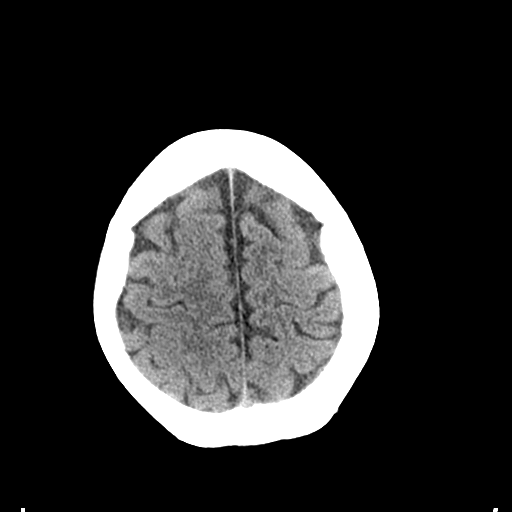
[im 25/31  brain]
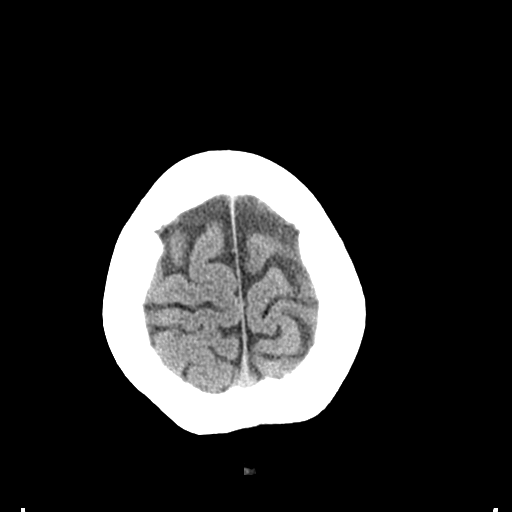
[im 25/31  bone]
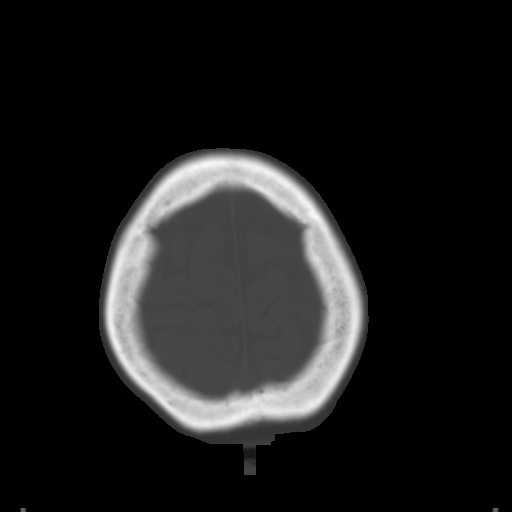
[im 28/31  brain]
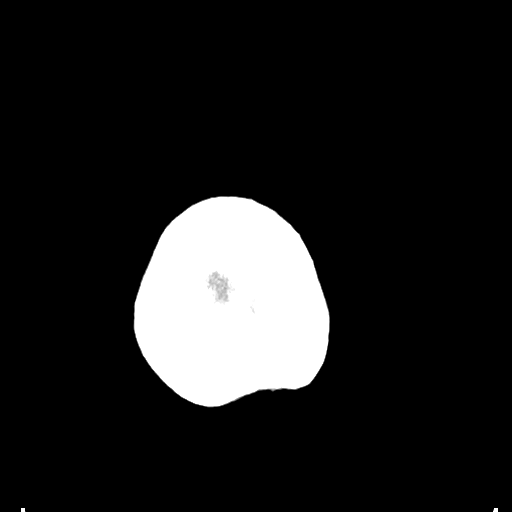

[Series 6: sagittal soft tissue · coronal · 0.29mm/px · 3 of 69 slices shown]
[im 18/69  brain]
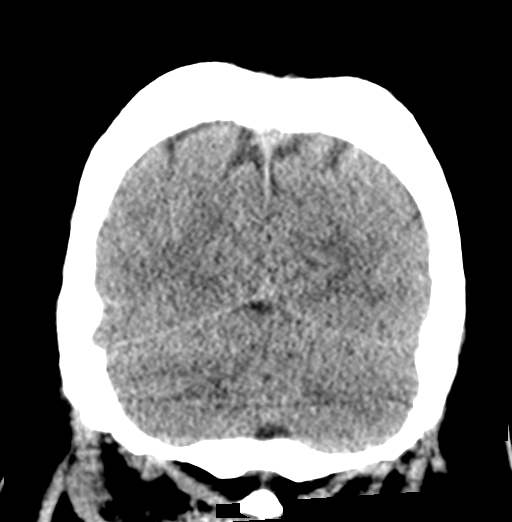
[im 35/69  brain]
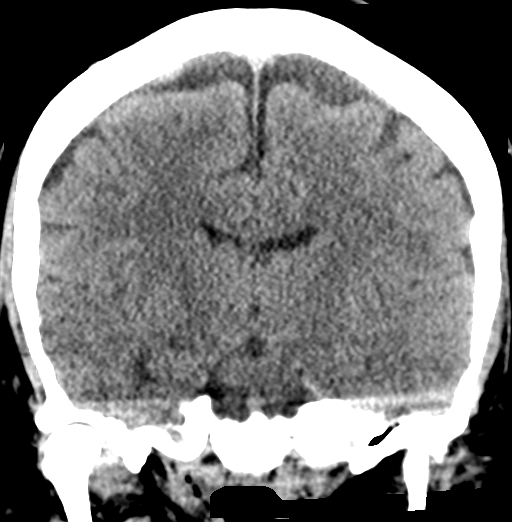
[im 52/69  brain]
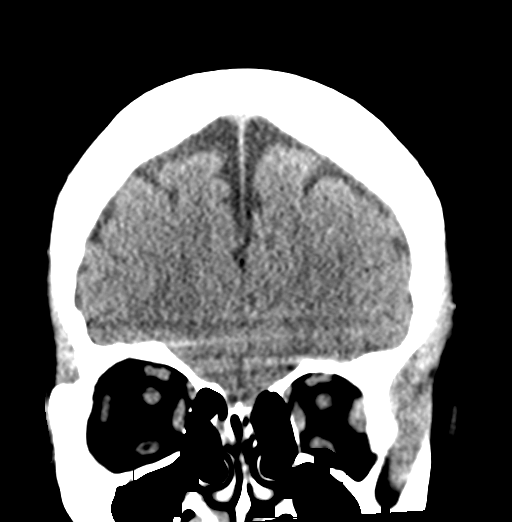

[Series 602: <mpr thick range> · sagittal · 0.50mm/px · 3 of 75 slices shown]
[im 25/75  brain]
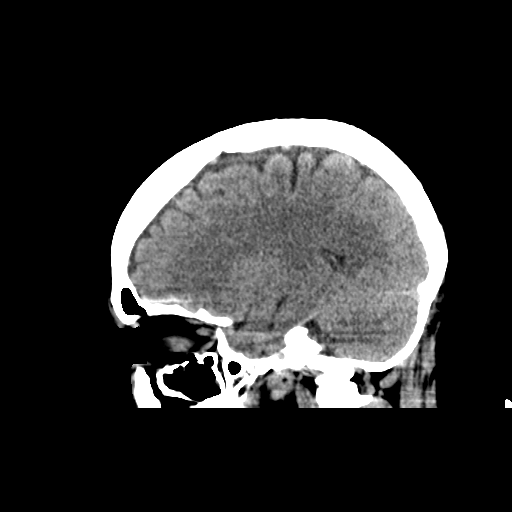
[im 38/75  brain]
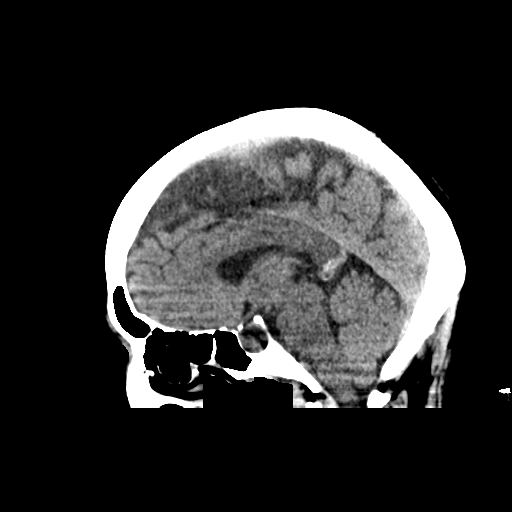
[im 50/75  brain]
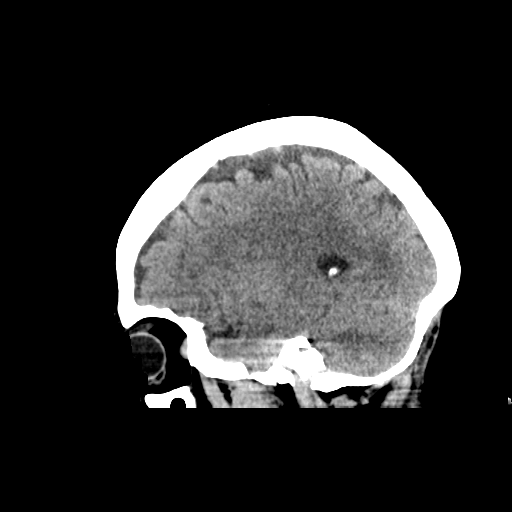

[16 of 47 positions shown; findings below may reference images not displayed]

FINDINGS: Brain: There is no mass, hemorrhage or extra-axial collection. The
size and configuration of the ventricles and extra-axial CSF spaces
are normal. The brain parenchyma is normal, without acute or chronic
infarction. Partially empty sella turcica.

Vascular: No abnormal hyperdensity of the major intracranial
arteries or dural venous sinuses. No intracranial atherosclerosis.

Skull: The visualized skull base, calvarium and extracranial soft
tissues are normal.

Sinuses/Orbits: No fluid levels or advanced mucosal thickening of
the visualized paranasal sinuses. No mastoid or middle ear effusion.
The orbits are normal.
IMPRESSION: 1. No acute intracranial abnormality.

## 2024-03-13 ENCOUNTER — Emergency Department (HOSPITAL_COMMUNITY)
Admission: EM | Admit: 2024-03-13 | Discharge: 2024-03-14 | Disposition: A | Discharge status: Home / Self Care | Source: Skilled Nursing Facility | Attending: Emergency Medicine | Admitting: Emergency Medicine

## 2024-03-13 DIAGNOSIS — I10 Essential (primary) hypertension: Secondary | ICD-10-CM | POA: Diagnosis not present

## 2024-03-13 DIAGNOSIS — R079 Chest pain, unspecified: Secondary | ICD-10-CM

## 2024-03-13 DIAGNOSIS — Z79899 Other long term (current) drug therapy: Secondary | ICD-10-CM | POA: Diagnosis not present

## 2024-03-13 DIAGNOSIS — Z7984 Long term (current) use of oral hypoglycemic drugs: Secondary | ICD-10-CM | POA: Insufficient documentation

## 2024-03-13 DIAGNOSIS — R0789 Other chest pain: Secondary | ICD-10-CM | POA: Diagnosis present

## 2024-03-13 DIAGNOSIS — E119 Type 2 diabetes mellitus without complications: Secondary | ICD-10-CM | POA: Diagnosis not present

## 2024-03-13 NOTE — ED Triage Notes (Signed)
Chest pain for 3 days.

## 2024-03-13 NOTE — ED Triage Notes (Signed)
 POV, complaints of chest pain, pressure on the chest with burping, and pressure on the tip of the nipple that patient feels pooping.  Pt report starting a new medication 3 weeks ago, Contrave. Report all the symptoms became worse since patient started Contrave.   Alert and oriented on triage. Pain 3 out 10. Feels more like pressure

## 2024-03-14 ENCOUNTER — Emergency Department (HOSPITAL_COMMUNITY)

## 2024-03-14 ENCOUNTER — Encounter (HOSPITAL_COMMUNITY): Payer: Self-pay

## 2024-03-14 LAB — BASIC METABOLIC PANEL WITH GFR
Anion gap: 11 (ref 5–15)
BUN: 18 mg/dL (ref 6–20)
CO2: 24 mmol/L (ref 22–32)
Calcium: 9.4 mg/dL (ref 8.9–10.3)
Chloride: 103 mmol/L (ref 98–111)
Creatinine, Ser: 0.91 mg/dL (ref 0.44–1.00)
GFR, Estimated: >60 mL/min (ref >60)
Glucose, Bld: 105 mg/dL — ABNORMAL HIGH (ref 70–99)
Potassium: 3.9 mmol/L (ref 3.5–5.1)
Sodium: 138 mmol/L (ref 135–145)

## 2024-03-14 LAB — CBC
HCT: 41.7 % (ref 36.0–46.0)
Hemoglobin: 13.4 g/dL (ref 12.0–15.0)
MCH: 27.5 pg (ref 26.0–34.0)
MCHC: 32.1 g/dL (ref 30.0–36.0)
MCV: 85.5 fL (ref 80.0–100.0)
Platelets: 286 K/uL (ref 150–400)
RBC: 4.88 MIL/uL (ref 3.87–5.11)
RDW: 13.3 % (ref 11.5–15.5)
WBC: 7.1 K/uL (ref 4.0–10.5)
nRBC: 0 % (ref 0.0–0.2)

## 2024-03-14 LAB — TROPONIN I (HIGH SENSITIVITY)
Troponin I (High Sensitivity): 6 ng/L (ref <18)
Troponin I (High Sensitivity): 5 ng/L (ref <18)

## 2024-03-14 LAB — D-DIMER, QUANTITATIVE: D-Dimer, Quant: 0.36 ug{FEU}/mL (ref 0.00–0.50)

## 2024-03-14 NOTE — Discharge Instructions (Addendum)
 Follow-up with your primary care doctor in 3 days for recheck of symptoms.  Your workup today was overall reassuring.  I would recommend stopping this new medication Contrave.  Return to emergency room with new or worsening symptoms.

## 2024-03-14 NOTE — ED Provider Notes (Signed)
 [place] EMERGENCY DEPARTMENT AT [facility] Provider Note   CSN: [id number] Arrival date & time: 03/13/24  2328     Patient presents with: Chest Pain   Madison Huynh is a 58 y.o. female patient with past medical history of diabetes, hypertension presents to emergency room with 3 days of intermittent chest pain.  She locates chest pain to her left breast rating around the left side.  She is currently chest pain-free and has been for several hours.  She called her primary care about this who recommended ED visit.  Patient reports her symptoms started after starting Contrave and she is concerned that this is a side effect of her medication.  She denies any exertional chest pain or exertional shortness of breath.  She feels that this comes on randomly.  She denies any swelling in her feet and ankles.  No history of DVT or PE.  She denies any unilateral swelling or calf tenderness, recent travel or recent surgery. No SOB, Cough or fever.     Chest Pain      Prior to Admission medications   Medication Sig Start Date End Date Taking? Authorizing Provider  cetirizine  (ZYRTEC ) 10 MG tablet Take 1 tablet (10 mg total) by mouth daily. 06/04/20   Christopher Savannah, PA-C  cyclobenzaprine  (FLEXERIL ) 5 MG tablet Take 1 tablet (5 mg total) by mouth 3 (three) times daily as needed for muscle spasms. 11/23/15   Pennie Elsie PARAS, FNP  HYDROcodone -homatropine (HYCODAN) 5-1.5 MG/5ML syrup Take 5 mLs by mouth every 8 (eight) hours as needed for cough. 10/14/13   Mario Million, MD  ibuprofen  (ADVIL ,MOTRIN ) 200 MG tablet Take 200 mg by mouth every 6 (six) hours as needed.    [provider]  ibuprofen  (ADVIL ,MOTRIN ) 800 MG tablet Take 800 mg by mouth every 8 (eight) hours as needed.    [provider]  levothyroxine (SYNTHROID, LEVOTHROID) 50 MCG tablet Take 50 mcg by mouth daily before breakfast.    [provider]  lisinopril (PRINIVIL,ZESTRIL) 10 MG tablet Take 10 mg by  mouth daily.    [provider]  metFORMIN (GLUCOPHAGE) 500 MG tablet Take by mouth 2 (two) times daily with a meal.    [provider]  naproxen  (NAPROSYN ) 500 MG tablet Take 1 tablet (500 mg total) by mouth 2 (two) times daily. 11/23/15   Pennie Elsie PARAS, FNP  omeprazole  (PRILOSEC) 20 MG capsule Take 1 capsule (20 mg total) by mouth daily. 05/23/21   Vonna Sharlet POUR, MD  ondansetron  (ZOFRAN -ODT) 4 MG disintegrating tablet Take 1 tablet (4 mg total) by mouth every 8 (eight) hours as needed for nausea or vomiting. 05/23/21   Banister, Pamela K, MD  orlistat (ALLI) 60 MG capsule Take 60 mg by mouth 3 (three) times daily with meals.    [provider]  oseltamivir  (TAMIFLU ) 75 MG capsule Take 1 capsule (75 mg total) by mouth 2 (two) times daily. 06/04/20   Christopher Savannah, PA-C  predniSONE  (DELTASONE ) 20 MG tablet 2 daily with food 10/14/13   Mario Million, MD  pseudoephedrine  (SUDAFED) 30 MG tablet Take 1 tablet (30 mg total) by mouth every 8 (eight) hours as needed for congestion. 06/04/20   Christopher Savannah, PA-C  tobramycin  (TOBREX ) 0.3 % ophthalmic solution Place 1 drop into the left eye every 6 (six) hours. 10/14/13   Mario Million, MD  traMADol  (ULTRAM ) 50 MG tablet Take 1 tablet (50 mg total) by mouth every 6 (six) hours as needed. 10/08/15  Eloy Dorothyann DEL, NP    Allergies: Patient has no known allergies.    Review of Systems  Cardiovascular:  Positive for chest pain.    Updated Vital Signs BP (!) 142/80   Pulse (!) 57   Temp 98 F (36.7 C) (Oral)   Resp 16   SpO2 100%   Physical Exam Vitals and nursing note reviewed.  Constitutional:      General: She is not in acute distress.    Appearance: She is not toxic-appearing.  HENT:     Head: Normocephalic and atraumatic.  Eyes:     General: No scleral icterus.    Conjunctiva/sclera: Conjunctivae normal.  Cardiovascular:     Rate and Rhythm: Normal rate and regular rhythm.     Pulses: Normal pulses.      Heart sounds: Normal heart sounds.  Pulmonary:     Effort: Pulmonary effort is normal. No respiratory distress.     Breath sounds: Normal breath sounds.  Abdominal:     General: Abdomen is flat. Bowel sounds are normal.     Palpations: Abdomen is soft.     Tenderness: There is no abdominal tenderness.  Skin:    General: Skin is warm and dry.     Findings: No lesion.  Neurological:     General: No focal deficit present.     Mental Status: She is alert and oriented to person, place, and time. Mental status is at baseline.     (all labs ordered are listed, but only abnormal results are displayed) Labs Reviewed  BASIC METABOLIC PANEL WITH GFR - Abnormal; Notable for the following components:      Result Value   Glucose, Bld 105 (*)    All other components within normal limits  CBC  D-DIMER, QUANTITATIVE  TROPONIN I (HIGH SENSITIVITY)  TROPONIN I (HIGH SENSITIVITY)    EKG: None  Radiology: DG Chest 2 View Result Date: 03/14/2024 EXAM: 2 VIEW(S) XRAY OF THE CHEST 03/14/2024 01:09:50 AM COMPARISON: Chest x-ray 03/30/2026. CLINICAL HISTORY: chest pain. chest pain FINDINGS: LUNGS AND PLEURA: No focal pulmonary opacity. No pulmonary edema. No pleural effusion. No pneumothorax. HEART AND MEDIASTINUM: Atherosclerotic plaque. No acute abnormality of the cardiac and mediastinal silhouettes. BONES AND SOFT TISSUES: Right upper quadrant surgical clips. No acute osseous abnormality. IMPRESSION: 1. No acute cardiopulmonary process. Electronically signed by: Morgane Naveau MD 03/14/2024 01:20 AM EDT RP Workstation: [id number]     Procedures   Medications Ordered in the ED - No data to display                                  Medical Decision Making Amount and/or Complexity of Data Reviewed Labs: ordered. Radiology: ordered.   This patient presents to the ED for concern of chest pain, this involves an extensive number of treatment options, and is a complaint that carries with it a  high risk of complications and morbidity.  The differential diagnosis includes ACS, stable angina, CHF, pneumonia, pneumothorax, aortic dissection, pulmonary embolus    Co morbidities that complicate the patient evaluation  Hypertension, diabetes   Lab Tests:  I personally interpreted labs.  The pertinent results include:   CBC unremarkable, BMP unremarkable Troponin 6, 5 D-dimer negative   Imaging Studies ordered:  I ordered imaging studies including chest x-ray I independently visualized and interpreted imaging which showed no acute cardiopulmonary process I agree with the radiologist interpretation   Cardiac Monitoring: /  EKG:  The patient was maintained on a cardiac monitor.  I personally viewed and interpreted the cardiac monitored which showed an underlying rhythm of: sinus  Problem List / ED Course / Critical interventions / Medication management  Presents to emergency room with complaint of chest pain.  EKG is nonischemic and troponin x 2 is unremarkable.  Her D-dimer is negative thus doubt DVT PE as cause of symptoms.  She has no sign of pneumonia or pneumothorax on chest x-ray.  Her symptoms are not consistent with aortic dissection.  She has no sign of fluid overload on exam and no history of heart failure.  Overall her workup today has been reassuring.  Given that her symptoms started after medication change questioning if this is a result of her medications.  Patient reports her doctor has already told her to stop the medicine and so she was discontinue taking it. I have reviewed the patients home medicines and have made adjustments as needed. Overall reassuring story and workup feel stable for discharge with close outpatient.  Patient was given strict return precautions.        Final diagnoses:  Chest pain, unspecified type    ED Discharge Orders     None          Shermon Warren SAILOR, PA-C 03/14/24 1053    Rogelia Jerilynn RAMAN, MD 03/14/24 1134
